# Patient Record
Sex: Female | Born: 1980 | Race: White | Hispanic: No | Marital: Married | Smoking: Current every day smoker
Health system: Southern US, Community
[De-identification: ages and names within clinical notes are randomized; demographics above are authoritative.]

## PROBLEM LIST (undated history)

## (undated) DIAGNOSIS — E079 Disorder of thyroid, unspecified: Secondary | ICD-10-CM

## (undated) DIAGNOSIS — M797 Fibromyalgia: Secondary | ICD-10-CM

## (undated) DIAGNOSIS — F329 Major depressive disorder, single episode, unspecified: Secondary | ICD-10-CM

## (undated) DIAGNOSIS — F32A Depression, unspecified: Secondary | ICD-10-CM

## (undated) DIAGNOSIS — F909 Attention-deficit hyperactivity disorder, unspecified type: Secondary | ICD-10-CM

## (undated) DIAGNOSIS — C801 Malignant (primary) neoplasm, unspecified: Secondary | ICD-10-CM

## (undated) DIAGNOSIS — F419 Anxiety disorder, unspecified: Secondary | ICD-10-CM

## (undated) DIAGNOSIS — N83209 Unspecified ovarian cyst, unspecified side: Secondary | ICD-10-CM

## (undated) HISTORY — PX: APPENDECTOMY: SHX54

## (undated) HISTORY — PX: TONSILLECTOMY: SUR1361

---

## 2005-02-27 ENCOUNTER — Emergency Department: Payer: Self-pay | Admitting: Emergency Medicine

## 2005-03-29 ENCOUNTER — Emergency Department: Payer: Self-pay | Admitting: Emergency Medicine

## 2007-03-06 ENCOUNTER — Ambulatory Visit: Payer: Self-pay | Admitting: Internal Medicine

## 2007-03-07 ENCOUNTER — Inpatient Hospital Stay: Payer: Self-pay | Admitting: General Surgery

## 2008-03-27 ENCOUNTER — Ambulatory Visit: Payer: Self-pay | Admitting: Internal Medicine

## 2010-04-29 ENCOUNTER — Ambulatory Visit: Payer: Self-pay | Admitting: Family Medicine

## 2010-11-10 ENCOUNTER — Ambulatory Visit: Payer: Self-pay | Admitting: Family Medicine

## 2012-01-27 ENCOUNTER — Ambulatory Visit: Payer: Self-pay

## 2013-02-01 ENCOUNTER — Ambulatory Visit: Payer: Self-pay | Admitting: Family Medicine

## 2013-03-02 ENCOUNTER — Ambulatory Visit: Payer: Self-pay | Admitting: Family Medicine

## 2013-03-02 LAB — URINALYSIS, COMPLETE
Bilirubin,UR: NEGATIVE
Glucose,UR: NEGATIVE mg/dL (ref 0–75)
Ketone: NEGATIVE
Nitrite: NEGATIVE

## 2013-03-02 LAB — PREGNANCY, URINE: Pregnancy Test, Urine: NEGATIVE m[IU]/mL

## 2013-11-23 ENCOUNTER — Ambulatory Visit: Payer: Self-pay | Admitting: Family Medicine

## 2013-11-24 ENCOUNTER — Ambulatory Visit: Payer: Self-pay | Admitting: Emergency Medicine

## 2017-04-22 ENCOUNTER — Encounter: Payer: Self-pay | Admitting: Gynecology

## 2017-04-22 ENCOUNTER — Ambulatory Visit
Admission: EM | Admit: 2017-04-22 | Discharge: 2017-04-22 | Disposition: A | Discharge status: Home / Self Care | Payer: Self-pay | Attending: Family Medicine | Admitting: Family Medicine

## 2017-04-22 DIAGNOSIS — R339 Retention of urine, unspecified: Secondary | ICD-10-CM

## 2017-04-22 DIAGNOSIS — N3289 Other specified disorders of bladder: Secondary | ICD-10-CM

## 2017-04-22 HISTORY — DX: Anxiety disorder, unspecified: F41.9

## 2017-04-22 HISTORY — DX: Disorder of thyroid, unspecified: E07.9

## 2017-04-22 HISTORY — DX: Depression, unspecified: F32.A

## 2017-04-22 HISTORY — DX: Attention-deficit hyperactivity disorder, unspecified type: F90.9

## 2017-04-22 HISTORY — DX: Unspecified ovarian cyst, unspecified side: N83.209

## 2017-04-22 HISTORY — DX: Major depressive disorder, single episode, unspecified: F32.9

## 2017-04-22 HISTORY — DX: Fibromyalgia: M79.7

## 2017-04-22 LAB — URINALYSIS, COMPLETE (UACMP) WITH MICROSCOPIC
BACTERIA UA: NONE SEEN
Bilirubin Urine: NEGATIVE
Glucose, UA: NEGATIVE mg/dL
Hgb urine dipstick: NEGATIVE
Ketones, ur: NEGATIVE mg/dL
Leukocytes, UA: NEGATIVE
Nitrite: NEGATIVE
PH: 7.5 (ref 5.0–8.0)
Protein, ur: NEGATIVE mg/dL
RBC / HPF: NONE SEEN RBC/hpf (ref 0–5)
Specific Gravity, Urine: 1.02 (ref 1.005–1.030)
WBC, UA: NONE SEEN WBC/hpf (ref 0–5)

## 2017-04-22 MED ORDER — PHENAZOPYRIDINE HCL 200 MG PO TABS: 200.0000 mg | ORAL_TABLET | Freq: Three times a day (TID) | ORAL | 0 refills | Status: DC | PRN

## 2017-04-22 MED ORDER — TAMSULOSIN HCL 0.4 MG PO CAPS: 0.4000 mg | ORAL_CAPSULE | Freq: Every day | ORAL | 0 refills | Status: DC

## 2017-04-22 NOTE — ED Triage Notes (Signed)
Patient c/o lower back pain and urine retention x yesterday.

## 2017-04-22 NOTE — ED Provider Notes (Signed)
[facility]    CSN: [id number] Arrival date & time: 04/22/17  1358     History   Chief Complaint Chief Complaint  Patient presents with  . Recurrent UTI    HPI Candace Randall is a 36 y.o. female.   Patient is a 36 year old white female who states yesterday she started having sensation of not emptying her bladder completely. Results the bathroom many times but feels that there is something still left in the bladder which is ago. She reports having this years ago she was on Flomax but she did not agree with her and she has some side effects to Flomax. This is atypical for typical UTIs she has. She does smoke. She has a history of ADHD anxiety depression fibromyalgia ovarian cyst disease and right oophorectomy. She's had appendectomy C-section tonsillectomy and right salpingectomy she is allergic to sulfa drugs and latex. No pertinent family medical history relevant to today's visit.   The history is provided by the patient. No language interpreter was used.  Urinary Frequency  The current episode started yesterday. The problem has not changed since onset.Pertinent negatives include no chest pain, no abdominal pain, no headaches and no shortness of breath. Nothing aggravates the symptoms. Nothing relieves the symptoms. She has tried nothing for the symptoms. The treatment provided no relief.    Past Medical History:  Diagnosis Date  . ADHD   . Anxiety   . Depression   . Fibromyalgia   . Ovarian cyst   . Thyroid disease     There are no active problems to display for this patient.   Past Surgical History:  Procedure Laterality Date  . APPENDECTOMY    . CESAREAN SECTION    . TONSILLECTOMY      OB History    No data available       Home Medications    Prior to Admission medications   Medication Sig Start Date End Date Taking? Authorizing Provider  levothyroxine (SYNTHROID, LEVOTHROID) 50 MCG tablet Take 50 mcg by mouth daily before breakfast.    Yes [provider]  norethindrone-ethinyl estradiol-iron (ESTROSTEP FE,TILIA FE,TRI-LEGEST FE) 1-20/1-30/1-35 MG-MCG tablet Take 1 tablet by mouth daily.   Yes [provider]  phenazopyridine (PYRIDIUM) 200 MG tablet Take 1 tablet (200 mg total) by mouth 3 (three) times daily as needed for pain. 04/22/17   Frederich Cha, MD  tamsulosin (FLOMAX) 0.4 MG CAPS capsule Take 1 capsule (0.4 mg total) by mouth daily after supper. 1/2 to 1 capsule at night 04/22/17   Frederich Cha, MD    Family History No family history on file.  Social History Social History  Substance Use Topics  . Smoking status: Current Every Day Smoker    Packs/day: 0.50  . Smokeless tobacco: Never Used  . Alcohol use No     Allergies   Sulfa antibiotics and Latex   Review of Systems Review of Systems  Respiratory: Negative for shortness of breath.   Cardiovascular: Negative for chest pain.  Gastrointestinal: Negative for abdominal pain.  Genitourinary: Positive for frequency. Negative for vaginal discharge and vaginal pain.  Neurological: Negative for headaches.  All other systems reviewed and are negative.    Physical Exam Triage Vital Signs ED Triage Vitals  Enc Vitals Group     BP 04/22/17 1444 117/73     Pulse Rate 04/22/17 1444 (!) 109     Resp 04/22/17 1444 16     Temp 04/22/17 1444 98.7 F (37.1 C)  Temp Source 04/22/17 1444 Oral     SpO2 04/22/17 1444 97 %     Weight 04/22/17 1438 122 lb (55.3 kg)     Height 04/22/17 1438 5\' 2"  (1.575 m)     Head Circumference --      Peak Flow --      Pain Score 04/22/17 1439 8     Pain Loc --      Pain Edu? --      Excl. in [place]? --    No data found.   Updated Vital Signs BP 117/73 (BP Location: Left Arm)   Pulse (!) 109   Temp 98.7 F (37.1 C) (Oral)   Resp 16   Ht 5\' 2"  (1.575 m)   Wt 122 lb (55.3 kg)   LMP 04/08/2017   SpO2 97%   BMI 22.31 kg/m   Visual Acuity Right Eye Distance:   Left Eye Distance:   Bilateral  Distance:    Right Eye Near:   Left Eye Near:    Bilateral Near:     Physical Exam  Constitutional: She is oriented to person, place, and time. She appears well-developed and well-nourished.  HENT:  Head: Normocephalic and atraumatic.  Right Ear: External ear normal.  Left Ear: External ear normal.  Mouth/Throat: Oropharynx is clear and moist.  Eyes: Pupils are equal, round, and reactive to light. Conjunctivae are normal.  Neck: Normal range of motion.  Pulmonary/Chest: Effort normal.  Abdominal: Soft. She exhibits no distension. There is no tenderness.  Musculoskeletal: Normal range of motion.  Neurological: She is alert and oriented to person, place, and time.  Skin: Skin is warm.  Psychiatric: She has a normal mood and affect.  Vitals reviewed.    UC Treatments / Results  Labs (all labs ordered are listed, but only abnormal results are displayed) Labs Reviewed  URINALYSIS, COMPLETE (UACMP) WITH MICROSCOPIC - Abnormal; Notable for the following:       Result Value   Squamous Epithelial / LPF 6-30 (*)    All other components within normal limits  URINE CULTURE    EKG  EKG Interpretation None       Radiology No results found.  Procedures Procedures (including critical care time)  Medications Ordered in UC Medications - No data to display  Results for orders placed or performed during the hospital encounter of 04/22/17  Urinalysis, Complete w Microscopic  Result Value Ref Range   Color, Urine YELLOW YELLOW   APPearance CLEAR CLEAR   Specific Gravity, Urine 1.020 1.005 - 1.030   pH 7.5 5.0 - 8.0   Glucose, UA NEGATIVE NEGATIVE mg/dL   Hgb urine dipstick NEGATIVE NEGATIVE   Bilirubin Urine NEGATIVE NEGATIVE   Ketones, ur NEGATIVE NEGATIVE mg/dL   Protein, ur NEGATIVE NEGATIVE mg/dL   Nitrite NEGATIVE NEGATIVE   Leukocytes, UA NEGATIVE NEGATIVE   Squamous Epithelial / LPF 6-30 (A) NONE SEEN   WBC, UA NONE SEEN 0 - 5 WBC/hpf   RBC / HPF NONE SEEN 0 - 5  RBC/hpf   Bacteria, UA NONE SEEN NONE SEEN   Initial Impression / Assessment and Plan / UC Course  I have reviewed the triage vital signs and the nursing notes.  Pertinent labs & imaging results that were available during my care of the patient were reviewed by me and considered in my medical decision making (see chart for details).     Patient the urinalysis was really essentially unremarkable other than the cells in the urine. She  didn't tolerate the Flomax didn't make her feel well when she took it will go recommend she try maybe half a capsule she's going to try that and take it at night will place him Pyridium 3 times a day and this is just past time until we have back a urine culture just patient does not have a UTI but this sounds this is probably more of a spastic bladder base may have a follow-up urologist in the future.   Final Clinical Impressions(s) / UC Diagnoses   Final diagnoses:  Urinary retention with incomplete bladder emptying  Spasm of bladder   Note: This dictation was prepared with Dragon dictation along with smaller phrase technology. Any transcriptional errors that result from this process are unintentional. New Prescriptions New Prescriptions   PHENAZOPYRIDINE (PYRIDIUM) 200 MG TABLET    Take 1 tablet (200 mg total) by mouth 3 (three) times daily as needed for pain.   TAMSULOSIN (FLOMAX) 0.4 MG CAPS CAPSULE    Take 1 capsule (0.4 mg total) by mouth daily after supper. 1/2 to 1 capsule at night     Controlled Substance Prescriptions [place] Controlled Substance Registry consulted? Not Applicable   Frederich Cha, MD 04/22/17 [phone]

## 2017-04-23 LAB — URINE CULTURE
CULTURE: NO GROWTH
SPECIAL REQUESTS: NORMAL

## 2017-04-24 ENCOUNTER — Telehealth: Payer: Self-pay | Admitting: Emergency Medicine

## 2017-04-24 NOTE — Telephone Encounter (Signed)
Patient called today c/o nausea & vomiting, dizziness and flu like symptoms. Patient was started on Flomax on Saturday (04/22/17) and she is requesting it be changed to something else due to the symptoms she is having.

## 2017-04-24 NOTE — Telephone Encounter (Signed)
Called and left instructions on voice mail per Dr. Zenda Alpers.

## 2017-04-24 NOTE — Telephone Encounter (Signed)
Would not recommend taking flomax; can continue only pyridium; should follow up with PCP and/or urologist

## 2019-03-18 ENCOUNTER — Ambulatory Visit (INDEPENDENT_AMBULATORY_CARE_PROVIDER_SITE_OTHER): Payer: Self-pay

## 2019-03-18 ENCOUNTER — Ambulatory Visit
Admission: EM | Admit: 2019-03-18 | Discharge: 2019-03-18 | Disposition: A | Discharge status: Home / Self Care | Payer: Self-pay | Attending: Family Medicine | Admitting: Family Medicine

## 2019-03-18 DIAGNOSIS — R0602 Shortness of breath: Secondary | ICD-10-CM

## 2019-03-18 DIAGNOSIS — R05 Cough: Secondary | ICD-10-CM

## 2019-03-18 DIAGNOSIS — R059 Cough, unspecified: Secondary | ICD-10-CM

## 2019-03-18 DIAGNOSIS — Z7189 Other specified counseling: Secondary | ICD-10-CM

## 2019-03-18 DIAGNOSIS — T148XXA Other injury of unspecified body region, initial encounter: Secondary | ICD-10-CM

## 2019-03-18 DIAGNOSIS — M545 Low back pain: Secondary | ICD-10-CM

## 2019-03-18 LAB — FIBRIN DERIVATIVES D-DIMER (ARMC ONLY): Fibrin derivatives D-dimer (ARMC): 396.21 ng/mL (FEU) (ref 0.00–499.00)

## 2019-03-18 MED ORDER — CYCLOBENZAPRINE HCL 10 MG PO TABS: 10.0000 mg | ORAL_TABLET | Freq: Two times a day (BID) | ORAL | 0 refills | Status: DC | PRN

## 2019-03-18 MED ORDER — ALBUTEROL SULFATE HFA 108 (90 BASE) MCG/ACT IN AERS: 2.0000 | INHALATION_SPRAY | Freq: Four times a day (QID) | RESPIRATORY_TRACT | 0 refills | Status: DC | PRN

## 2019-03-18 NOTE — Discharge Instructions (Signed)
Take medication as prescribed. Rest. Drink plenty of fluids.  Over-the-counter cough congestion medications as needed.  Please refer to [facility] DHHS information and follow, remain home unless seeking further care.  Follow up with your primary care physician this week as needed. Return to Urgent care for new or worsening concerns.

## 2019-03-18 NOTE — ED Provider Notes (Signed)
[facility] ____________________________________________  Time seen: Approximately 7:36 PM  I have reviewed the triage vital signs and the nursing notes.   HISTORY  Chief Complaint Shortness of Breath   HPI Candace Randall is a 38 y.o. female presenting for evaluation of right shoulder pain and cough.  Patient reports she woke up with pain to her right shoulder on Friday and what she thought was from moving furniture around on Thursday.  Reports the pain continued into Saturday and Sunday, stating she also noticed that she was having pain shooting into her right shoulder with taking breaths.  Patient also reports in the last 2 days she has been having cough and congestion.  States she is having some shortness of breath described as tightness in her chest and almost wheezy.  States shortness of breath is not constant.  States that she feels like there is phlegm in her chest but not able to produce it.  No associated chest pain.  Denies fevers or sore throat.  Positive nasal congestion.  Denies known sick contacts.  Has continued remain active, no immobilization.  No hemoptysis.  States she was worried about pneumonia as she has had that previously with similar sensation.  Unresolved with over-the-counter ibuprofen.  Movement does increase discomfort.  No extremity swelling.  No history of cancer, PE, DVT.  She is a smoker.  On oral contraceptives.  Continues eat and drink well.  No vomiting, diarrhea, abdominal pain, rash, change in taste or smell.  Care, [place] [facility]: PCP   Past Medical History:  Diagnosis Date  . ADHD   . Anxiety   . Depression   . Fibromyalgia   . Ovarian cyst   . Thyroid disease     There are no active problems to display for this patient.   Past Surgical History:  Procedure Laterality Date  . APPENDECTOMY    . CESAREAN SECTION    . TONSILLECTOMY       No current facility-administered medications for this encounter.   Current  Outpatient Medications:  .  albuterol (VENTOLIN HFA) 108 (90 Base) MCG/ACT inhaler, Inhale 2 puffs into the lungs every 6 (six) hours as needed for wheezing., Disp: 6.7 g, Rfl: 0 .  cyclobenzaprine (FLEXERIL) 10 MG tablet, Take 1 tablet (10 mg total) by mouth 2 (two) times daily as needed for muscle spasms. Do not drive while taking as can cause drowsiness, Disp: 15 tablet, Rfl: 0 .  levothyroxine (SYNTHROID, LEVOTHROID) 50 MCG tablet, Take 50 mcg by mouth daily before breakfast., Disp: , Rfl:  .  norethindrone-ethinyl estradiol-iron (ESTROSTEP FE,TILIA FE,TRI-LEGEST FE) 1-20/1-30/1-35 MG-MCG tablet, Take 1 tablet by mouth daily., Disp: , Rfl:  .  phenazopyridine (PYRIDIUM) 200 MG tablet, Take 1 tablet (200 mg total) by mouth 3 (three) times daily as needed for pain., Disp: 15 tablet, Rfl: 0 .  tamsulosin (FLOMAX) 0.4 MG CAPS capsule, Take 1 capsule (0.4 mg total) by mouth daily after supper. 1/2 to 1 capsule at night, Disp: 30 capsule, Rfl: 0  Allergies Sulfa antibiotics and Latex  No family history on file.  Social History Social History   Tobacco Use  . Smoking status: Current Every Day Smoker    Packs/day: 0.05  . Smokeless tobacco: Never Used  Substance Use Topics  . Alcohol use: No  . Drug use: No    Review of Systems Constitutional: No fever. ENT: No sore throat. Cardiovascular: Denies chest pain. Respiratory: positive shortness of breath. Gastrointestinal: No abdominal pain.  No  nausea, no vomiting.  No diarrhea.  No constipation. Genitourinary: Negative for dysuria. Musculoskeletal: Positive for back pain. Skin: Negative for rash. Neurological: Negative for headaches, focal weakness or numbness.   ____________________________________________   PHYSICAL EXAM:  VITAL SIGNS: ED Triage Vitals  Enc Vitals Group     BP 03/18/19 1822 129/79     Pulse Rate 03/18/19 1822 (!) 107     Resp 03/18/19 1822 16     Temp 03/18/19 1822 98.7 F (37.1 C)     Temp Source  03/18/19 1822 Oral     SpO2 03/18/19 1822 98 %     Weight --      Height --      Head Circumference --      Peak Flow --      Pain Score 03/18/19 1825 7     Pain Loc --      Pain Edu? --      Excl. in [place]? --     Constitutional: Alert and oriented. Well appearing and in no acute distress. Eyes: Conjunctivae are normal.  Head: Atraumatic. No sinus tenderness to palpation. No swelling. No erythema.  Ears: no erythema, normal TMs bilaterally.   Nose:Nasal congestion  Cardiovascular: Normal rate, regular rhythm. Grossly normal heart sounds.  Good peripheral circulation. Respiratory: Normal respiratory effort.  No retractions. No wheezes, rales or rhonchi. Good air movement.  Musculoskeletal: Ambulatory with steady gait. No cervical, thoracic or lumbar tenderness to palpation.  No extremity edema noted bilaterally. Except: Right trapezius tenderness to palpation as well as palpable muscle spasm with right cervical rotation, no midline tenderness. Neurologic:  Normal speech and language. No gait instability. Skin:  Skin appears warm, dry and intact. No rash noted. Psychiatric: Mood and affect are normal. Speech and behavior are normal. ___________________________________________   LABS (all labs ordered are listed, but only abnormal results are displayed)  Labs Reviewed  NOVEL CORONAVIRUS, NAA (HOSP ORDER, SEND-OUT TO REF LAB; TAT 18-24 HRS)  FIBRIN DERIVATIVES D-DIMER ([facility] ONLY)    RADIOLOGY  Dg Chest 2 View  Result Date: 03/18/2019 CLINICAL DATA:  Cough, shortness of breath EXAM: CHEST - 2 VIEW COMPARISON:  03/27/2008 FINDINGS: The heart size and mediastinal contours are within normal limits. Both lungs are clear. The visualized skeletal structures are unremarkable. IMPRESSION: No active cardiopulmonary disease. Electronically Signed   By: Jerilynn Mages.  Shick M.D.   On: 03/18/2019 19:35   ____________________________________________   PROCEDURES Procedures    INITIAL IMPRESSION /  ASSESSMENT AND PLAN / ED COURSE  Pertinent labs & imaging results that were available during my care of the patient were reviewed by me and considered in my medical decision making (see chart for details).  Overall well-appearing patient.  No acute distress.  Suspect viral illness as well muscular strain injury.  Chest x-ray as above, negative.  COVID-19 testing completed, [place] DHHS information given and counseled regarding supportive care, remain home unless seeking further care.  D-dimer negative.  Will treat supportively with PRN Flexeril, PRN albuterol inhaler and continue over-the-counter cough congestion medication as needed.Discussed indication, risks and benefits of medications with patient.  Discussed follow up with Primary care physician this week. Discussed follow up and return parameters including no resolution or any worsening concerns. Patient verbalized understanding and agreed to plan.   ____________________________________________   FINAL CLINICAL IMPRESSION(S) / ED DIAGNOSES  Final diagnoses:  Shortness of breath  Cough  Muscle strain  Advice Given About Covid-19 Virus Infection     ED Discharge Orders  Ordered    albuterol (VENTOLIN HFA) 108 (90 Base) MCG/ACT inhaler  Every 6 hours PRN     03/18/19 1958    cyclobenzaprine (FLEXERIL) 10 MG tablet  2 times daily PRN     03/18/19 1958           Note: This dictation was prepared with Dragon dictation along with smaller phrase technology. Any transcriptional errors that result from this process are unintentional.         Marylene Land, NP 03/18/19 [phone]

## 2019-03-18 NOTE — ED Notes (Signed)
Pt in treatment room.  In NAD.  Needs address.  Pt/Fam updated on POC.

## 2019-03-18 NOTE — ED Triage Notes (Signed)
Feels as if can't take deep breath in.   Started Friday with this.  Thursday was lifting heavy things.  But thought it was associated with pulled muscle.  It just didn't go away.   No sick contacts.  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   OTCs at home hasn't fixed all.  Pain has alleviated a bit, but deep breathing worsens pain.    Cough/clearing of throat started Saturday.  Kind of a phlegmy feeling, but "nothing I can produce".   Able to speak in full sentences.  No breathing difficulty noted.    Was tested prior in April due to her father's condition.  It was negative then.

## 2019-03-20 LAB — NOVEL CORONAVIRUS, NAA (HOSP ORDER, SEND-OUT TO REF LAB; TAT 18-24 HRS): SARS-CoV-2, NAA: NOT DETECTED

## 2019-05-02 ENCOUNTER — Encounter: Payer: Self-pay | Admitting: Emergency Medicine

## 2019-05-02 ENCOUNTER — Emergency Department: Payer: Self-pay

## 2019-05-02 ENCOUNTER — Emergency Department
Admission: EM | Admit: 2019-05-02 | Discharge: 2019-05-03 | Disposition: A | Discharge status: Home / Self Care | Payer: Self-pay | Attending: Emergency Medicine | Admitting: Emergency Medicine

## 2019-05-02 ENCOUNTER — Ambulatory Visit
Admission: EM | Admit: 2019-05-02 | Discharge: 2019-05-02 | Disposition: A | Discharge status: Home / Self Care | Payer: Self-pay

## 2019-05-02 ENCOUNTER — Other Ambulatory Visit: Payer: Self-pay

## 2019-05-02 DIAGNOSIS — R300 Dysuria: Secondary | ICD-10-CM | POA: Insufficient documentation

## 2019-05-02 DIAGNOSIS — R112 Nausea with vomiting, unspecified: Secondary | ICD-10-CM | POA: Insufficient documentation

## 2019-05-02 DIAGNOSIS — Z859 Personal history of malignant neoplasm, unspecified: Secondary | ICD-10-CM | POA: Insufficient documentation

## 2019-05-02 DIAGNOSIS — F17200 Nicotine dependence, unspecified, uncomplicated: Secondary | ICD-10-CM | POA: Insufficient documentation

## 2019-05-02 DIAGNOSIS — R102 Pelvic and perineal pain: Secondary | ICD-10-CM | POA: Insufficient documentation

## 2019-05-02 DIAGNOSIS — Z79899 Other long term (current) drug therapy: Secondary | ICD-10-CM | POA: Insufficient documentation

## 2019-05-02 DIAGNOSIS — R52 Pain, unspecified: Secondary | ICD-10-CM

## 2019-05-02 DIAGNOSIS — R1031 Right lower quadrant pain: Secondary | ICD-10-CM

## 2019-05-02 DIAGNOSIS — R32 Unspecified urinary incontinence: Secondary | ICD-10-CM | POA: Insufficient documentation

## 2019-05-02 DIAGNOSIS — Z91040 Latex allergy status: Secondary | ICD-10-CM | POA: Insufficient documentation

## 2019-05-02 HISTORY — DX: Malignant (primary) neoplasm, unspecified: C80.1

## 2019-05-02 LAB — URINALYSIS, COMPLETE (UACMP) WITH MICROSCOPIC
Bacteria, UA: NONE SEEN
Bilirubin Urine: NEGATIVE
Glucose, UA: NEGATIVE mg/dL
Ketones, ur: NEGATIVE mg/dL
Leukocytes,Ua: NEGATIVE
Nitrite: NEGATIVE
Protein, ur: NEGATIVE mg/dL
Specific Gravity, Urine: 1.016 (ref 1.005–1.030)
pH: 6 (ref 5.0–8.0)

## 2019-05-02 LAB — COMPREHENSIVE METABOLIC PANEL
ALT: 15 U/L (ref 0–44)
AST: 16 U/L (ref 15–41)
Albumin: 4 g/dL (ref 3.5–5.0)
Alkaline Phosphatase: 61 U/L (ref 38–126)
Anion gap: 7 (ref 5–15)
BUN: 10 mg/dL (ref 6–20)
CO2: 26 mmol/L (ref 22–32)
Calcium: 9.2 mg/dL (ref 8.9–10.3)
Chloride: 103 mmol/L (ref 98–111)
Creatinine, Ser: 0.63 mg/dL (ref 0.44–1.00)
GFR calc Af Amer: >60 mL/min (ref >60)
GFR calc non Af Amer: >60 mL/min (ref >60)
Glucose, Bld: 107 mg/dL — ABNORMAL HIGH (ref 70–99)
Potassium: 3.8 mmol/L (ref 3.5–5.1)
Sodium: 136 mmol/L (ref 135–145)
Total Bilirubin: 0.3 mg/dL (ref 0.3–1.2)
Total Protein: 7.2 g/dL (ref 6.5–8.1)

## 2019-05-02 LAB — LIPASE, BLOOD: Lipase: 23 U/L (ref 11–51)

## 2019-05-02 LAB — CBC
HCT: 38.5 % (ref 36.0–46.0)
Hemoglobin: 12.6 g/dL (ref 12.0–15.0)
MCH: 27.1 pg (ref 26.0–34.0)
MCHC: 32.7 g/dL (ref 30.0–36.0)
MCV: 82.8 fL (ref 80.0–100.0)
Platelets: 349 10*3/uL (ref 150–400)
RBC: 4.65 MIL/uL (ref 3.87–5.11)
RDW: 12.9 % (ref 11.5–15.5)
WBC: 14.1 10*3/uL — ABNORMAL HIGH (ref 4.0–10.5)
nRBC: 0 % (ref 0.0–0.2)

## 2019-05-02 LAB — POCT PREGNANCY, URINE: Preg Test, Ur: NEGATIVE

## 2019-05-02 MED ORDER — MORPHINE SULFATE (PF) 4 MG/ML IV SOLN
4.0000 mg | Freq: Once | INTRAVENOUS | Status: AC
  Administered 2019-05-02: 4 mg via INTRAVENOUS
  Filled 2019-05-02: qty 1

## 2019-05-02 MED ORDER — ONDANSETRON 8 MG PO TBDP
8.0000 mg | ORAL_TABLET | Freq: Once | ORAL | Status: AC
  Administered 2019-05-02: 19:00:00 8 mg via ORAL

## 2019-05-02 MED ORDER — ONDANSETRON HCL 4 MG/2ML IJ SOLN
4.0000 mg | Freq: Once | INTRAMUSCULAR | Status: AC
  Administered 2019-05-02: 4 mg via INTRAVENOUS
  Filled 2019-05-02: qty 2

## 2019-05-02 NOTE — ED Triage Notes (Signed)
Patient c/o abdominal pain that started yesterday. She states she can feel a bump in her right lower abdomen. She also reports that today she feels bloated. Patient states she vomited x 2 day. Denies diarrhea.

## 2019-05-02 NOTE — ED Triage Notes (Signed)
Pt to ED from UC for CT and [place] for possible ovarian cyst. Pt sts UC only gave her zofran and did no lab or imaging. Pt has had LRQ pain x2 days with N/V.

## 2019-05-02 NOTE — ED Provider Notes (Signed)
[facility] Emergency Department Provider Note   First MD Initiated Contact with Patient 05/02/19 2336     (approximate)  I have reviewed the triage vital signs and the nursing notes.   HISTORY  Chief Complaint Abdominal Pain   HPI Candace Randall is a 38 y.o. female with below list of previous medical conditions presents to the emergency department secondary to 1 day history of lower abdominal pain which is currently 9 out of 10.  Patient states dysuria for over a year.  Patient denies any fever.  Patient does admit to nausea and vomiting.  Patient denies any diarrhea or constipation.  Patient states that she has been seen by gynecology and urology without any clear explanation for her dysuria or pelvic discomfort..  In addition patient admits to "difficulty holding my urine"        Past Medical History:  Diagnosis Date   ADHD    Anxiety    Cancer ([place])    Depression    Fibromyalgia    Ovarian cyst    Thyroid disease     There are no active problems to display for this patient.   Past Surgical History:  Procedure Laterality Date   APPENDECTOMY     CESAREAN SECTION     TONSILLECTOMY      Prior to Admission medications   Medication Sig Start Date End Date Taking? Authorizing Provider  albuterol (VENTOLIN HFA) 108 (90 Base) MCG/ACT inhaler Inhale 2 puffs into the lungs every 6 (six) hours as needed for wheezing. 03/18/19   Marylene Land, NP  clonazePAM (KLONOPIN) 0.5 MG tablet Take 0.5 mg by mouth 2 (two) times daily.    [provider]  cyclobenzaprine (FLEXERIL) 10 MG tablet Take 1 tablet (10 mg total) by mouth 2 (two) times daily as needed for muscle spasms. Do not drive while taking as can cause drowsiness 03/18/19   Marylene Land, NP  levothyroxine (SYNTHROID, LEVOTHROID) 50 MCG tablet Take 50 mcg by mouth daily before breakfast.    [provider]  norethindrone-ethinyl estradiol-iron (ESTROSTEP  FE,TILIA FE,TRI-LEGEST FE) 1-20/1-30/1-35 MG-MCG tablet Take 1 tablet by mouth daily.    [provider]  phenazopyridine (PYRIDIUM) 200 MG tablet Take 1 tablet (200 mg total) by mouth 3 (three) times daily as needed for pain. 04/22/17   Frederich Cha, MD  tamsulosin (FLOMAX) 0.4 MG CAPS capsule Take 1 capsule (0.4 mg total) by mouth daily after supper. 1/2 to 1 capsule at night 04/22/17   Frederich Cha, MD  traMADol (ULTRAM) 50 MG tablet Take by mouth every 6 (six) hours as needed.    [provider]  traMADol (ULTRAM) 50 MG tablet Take 1 tablet (50 mg total) by mouth every 6 (six) hours as needed. 05/03/19 05/02/20  Gregor Hams, MD    Allergies Sulfa antibiotics and Latex  History reviewed. No pertinent family history.  Social History Social History   Tobacco Use   Smoking status: Current Every Day Smoker    Packs/day: 0.05   Smokeless tobacco: Never Used  Substance Use Topics   Alcohol use: No   Drug use: No    Review of Systems Constitutional: No fever/chills Eyes: No visual changes. ENT: No sore throat. Cardiovascular: Denies chest pain. Respiratory: Denies shortness of breath. Gastrointestinal: Positive for abdominal pain nausea and vomiting no diarrhea.  No constipation. Genitourinary: Negative for dysuria. Musculoskeletal: Negative for neck pain.  Negative for back pain. Integumentary: Negative for rash. Neurological: Negative for headaches,  focal weakness or numbness.   ____________________________________________   PHYSICAL EXAM:  VITAL SIGNS: ED Triage Vitals  Enc Vitals Group     BP 05/02/19 2002 136/89     Pulse Rate 05/02/19 2002 (!) 120     Resp 05/02/19 2002 18     Temp 05/02/19 2002 99 F (37.2 C)     Temp Source 05/02/19 2002 Oral     SpO2 05/02/19 2002 100 %     Weight 05/02/19 2003 59 kg (130 lb)     Height 05/02/19 2003 1.575 m (5\' 2" )     Head Circumference --      Peak Flow --      Pain Score --      Pain Loc --       Pain Edu? --      Excl. in [place]? --     Constitutional: Alert and oriented.  Eyes: Conjunctivae are normal.  Mouth/Throat: Mucous membranes are moist. Neck: No stridor.  No meningeal signs.   Cardiovascular: Normal rate, regular rhythm. Good peripheral circulation. Grossly normal heart sounds. Respiratory: Normal respiratory effort.  No retractions. Gastrointestinal: Soft and nontender. No distention.  Genitourinary: No abnormal findings on external genitalia. Musculoskeletal: No lower extremity tenderness nor edema. No gross deformities of extremities. Neurologic:  Normal speech and language. No gross focal neurologic deficits are appreciated.  Skin:  Skin is warm, dry and intact. Psychiatric: Mood and affect are normal. Speech and behavior are normal.  ____________________________________________   LABS (all labs ordered are listed, but only abnormal results are displayed)  Labs Reviewed  WET PREP, GENITAL - Abnormal; Notable for the following components:      Result Value   WBC, Wet Prep HPF POC FEW (*)    All other components within normal limits  COMPREHENSIVE METABOLIC PANEL - Abnormal; Notable for the following components:   Glucose, Bld 107 (*)    All other components within normal limits  CBC - Abnormal; Notable for the following components:   WBC 14.1 (*)    All other components within normal limits  URINALYSIS, COMPLETE (UACMP) WITH MICROSCOPIC - Abnormal; Notable for the following components:   Color, Urine YELLOW (*)    APPearance HAZY (*)    Hgb urine dipstick SMALL (*)    All other components within normal limits  URINE CULTURE  LIPASE, BLOOD  POC URINE PREG, ED  POCT PREGNANCY, URINE   _________________________  RADIOLOGY I, [place] N Ion Gonnella, personally viewed and evaluated these images (plain radiographs) as part of my medical decision making, as well as reviewing the written report by the radiologist.  ED MD interpretation: No acute intra-abdominal  pelvic pathology noted on CT abdomen pelvis Pelvic ultrasound revealed no acute findings.  Official radiology report(s): Ct Abdomen Pelvis W Contrast  Result Date: 05/03/2019 CLINICAL DATA:  Left lower quadrant abdominal pain EXAM: CT ABDOMEN AND PELVIS WITH CONTRAST TECHNIQUE: Multidetector CT imaging of the abdomen and pelvis was performed using the standard protocol following bolus administration of intravenous contrast. CONTRAST:  165mL OMNIPAQUE IOHEXOL 300 MG/ML  SOLN COMPARISON:  Ultrasound same day FINDINGS: Lower chest: The visualized heart size within normal limits. No pericardial fluid/thickening. No hiatal hernia. The visualized portions of the lungs are clear. Hepatobiliary: The liver is normal in density without focal abnormality.The main portal vein is patent. No evidence of calcified gallstones, gallbladder wall thickening or biliary dilatation. Pancreas: Unremarkable. No pancreatic ductal dilatation or surrounding inflammatory changes. Spleen: Normal in size without focal abnormality. Adrenals/Urinary  Tract: Again noted is nodular thickening of the left adrenal gland. The right adrenal gland is unremarkable. He the kidneys and collecting system appear normal without evidence of urinary tract calculus or hydronephrosis. Bladder is unremarkable. Stomach/Bowel: The stomach, small bowel, and colon are normal in appearance. No inflammatory changes, wall thickening, or obstructive findings.Surgical suture in the right lower quadrant. Vascular/Lymphatic: There are no enlarged mesenteric, retroperitoneal, or pelvic lymph nodes. No significant vascular findings are present. Reproductive: Other: No evidence of abdominal wall mass or hernia. Musculoskeletal: No acute or significant osseous findings. IMPRESSION: No acute intra-abdominal or pelvic pathology. Electronically Signed   By: Prudencio Pair M.D.   On: 05/03/2019 01:27   US Pelvic Complete W Transvaginal And Torsion R/o  Result Date:  05/02/2019 CLINICAL DATA:  Right lower quadrant pain EXAM: TRANSABDOMINAL AND TRANSVAGINAL ULTRASOUND OF PELVIS DOPPLER ULTRASOUND OF OVARIES TECHNIQUE: Both transabdominal and transvaginal ultrasound examinations of the pelvis were performed. Transabdominal technique was performed for global imaging of the pelvis including uterus, ovaries, adnexal regions, and pelvic cul-de-sac. It was necessary to proceed with endovaginal exam following the transabdominal exam to visualize the uterus and ovaries. Color and duplex Doppler ultrasound was utilized to evaluate blood flow to the ovaries. COMPARISON:  None. FINDINGS: Uterus Measurements: 7.6 x 3.1 x 3.8 cm = volume: 48 mL. No fibroids or other mass visualized. Endometrium Thickness: 6 mm.  No focal abnormality visualized. Right ovary Measurements: 1.7 x 1.2 x 1.8 cm = volume: 2 mL. Normal appearance/no adnexal mass. Left ovary Surgically absent. Pulsed Doppler evaluation of the right ovary demonstrates normal low-resistance arterial and venous waveforms. Other findings No abnormal free fluid. IMPRESSION: Normal pelvic ultrasound and Doppler evaluation Electronically Signed   By: Ulyses Jarred M.D.   On: 05/02/2019 21:51    ____________________________________________   PROCEDURES   Procedure(s) performed (including Critical Care):  Procedures   ____________________________________________   INITIAL IMPRESSION / MDM / [place] / ED COURSE  As part of my medical decision making, I reviewed the following data within the electronic MEDICAL RECORD NUMBER  38 year old female presenting with above-stated history and physical exam secondary to chronic pain and dysuria.  No clear etiology for the patient's symptoms identified urinalysis revealed no evidence of UTI urine culture pending pelvic ultrasound and CT scan of the abdomen pelvis revealed no acute injury abdominal or pelvic pathology.  Patient will be referred to urology for further outpatient  evaluation  ____________________________________________  FINAL CLINICAL IMPRESSION(S) / ED DIAGNOSES  Final diagnoses:  Pain  Pelvic pain in female  Dysuria     MEDICATIONS GIVEN DURING THIS VISIT:  Medications  morphine 4 MG/ML injection 4 mg (4 mg Intravenous Given 05/02/19 2352)  ondansetron (ZOFRAN) injection 4 mg (4 mg Intravenous Given 05/02/19 2352)  iohexol (OMNIPAQUE) 9 MG/ML oral solution 1,000 mL (1,000 mLs Oral Contrast Given 05/03/19 0007)  iohexol (OMNIPAQUE) 300 MG/ML solution 100 mL (100 mLs Intravenous Contrast Given 05/03/19 0044)  morphine 4 MG/ML injection 4 mg (4 mg Intravenous Given 05/03/19 0142)     ED Discharge Orders         Ordered    traMADol (ULTRAM) 50 MG tablet  Every 6 hours PRN     05/03/19 0240          *Please note:  Emmalise Renze was evaluated in Emergency Department on 05/03/2019 for the symptoms described in the history of present illness. She was evaluated in the context of the global COVID-19 pandemic, which necessitated  consideration that the patient might be at risk for infection with the SARS-CoV-2 virus that causes COVID-19. Institutional protocols and algorithms that pertain to the evaluation of patients at risk for COVID-19 are in a state of rapid change based on information released by regulatory bodies including the CDC and federal and state organizations. These policies and algorithms were followed during the patient's care in the ED.  Some ED evaluations and interventions may be delayed as a result of limited staffing during the pandemic.*  Note:  This document was prepared using Dragon voice recognition software and may include unintentional dictation errors.   Gregor Hams, MD 05/03/19 [phone]

## 2019-05-02 NOTE — ED Provider Notes (Signed)
[facility] ____________________________________________  Time seen: Approximately 7:18 PM  I have reviewed the triage vital signs and the nursing notes.   HISTORY  Chief Complaint Abdominal Pain   HPI Candace Randall is a 38 y.o. female      presenting for evaluation of right lower abdominal pelvic pain present since yesterday that has been progressing and worsening.  Continues with normal bowel habits.  2 episodes of vomiting today with continued nausea with associated pain.  States pain does intermittently radiate to her right lower back.  No dysuria.  Declines fevers or injury.  Denies vaginal complaints.  Previous appendectomy as well as left oophorectomy second to ruptured ovarian cyst.  Expresses concern as current pain feels similar to previous ruptured ovarian cyst.  3 month oral contraceptive use, declines pregnancy.  States she is right around the time to be starting her menstrual cycle.  Denies recent sickness.    Past Medical History:  Diagnosis Date   ADHD    Anxiety    Depression    Fibromyalgia    Ovarian cyst    Thyroid disease     There are no active problems to display for this patient.   Past Surgical History:  Procedure Laterality Date   APPENDECTOMY     CESAREAN SECTION     TONSILLECTOMY       No current facility-administered medications for this encounter.   Current Outpatient Medications:    clonazePAM (KLONOPIN) 0.5 MG tablet, Take 0.5 mg by mouth 2 (two) times daily., Disp: , Rfl:    levothyroxine (SYNTHROID, LEVOTHROID) 50 MCG tablet, Take 50 mcg by mouth daily before breakfast., Disp: , Rfl:    norethindrone-ethinyl estradiol-iron (ESTROSTEP FE,TILIA FE,TRI-LEGEST FE) 1-20/1-30/1-35 MG-MCG tablet, Take 1 tablet by mouth daily., Disp: , Rfl:    traMADol (ULTRAM) 50 MG tablet, Take by mouth every 6 (six) hours as needed., Disp: , Rfl:    albuterol (VENTOLIN HFA) 108 (90 Base) MCG/ACT inhaler, Inhale 2 puffs  into the lungs every 6 (six) hours as needed for wheezing., Disp: 6.7 g, Rfl: 0   cyclobenzaprine (FLEXERIL) 10 MG tablet, Take 1 tablet (10 mg total) by mouth 2 (two) times daily as needed for muscle spasms. Do not drive while taking as can cause drowsiness, Disp: 15 tablet, Rfl: 0   phenazopyridine (PYRIDIUM) 200 MG tablet, Take 1 tablet (200 mg total) by mouth 3 (three) times daily as needed for pain., Disp: 15 tablet, Rfl: 0   tamsulosin (FLOMAX) 0.4 MG CAPS capsule, Take 1 capsule (0.4 mg total) by mouth daily after supper. 1/2 to 1 capsule at night, Disp: 30 capsule, Rfl: 0  Allergies Sulfa antibiotics and Latex  History reviewed. No pertinent family history.  Social History Social History   Tobacco Use   Smoking status: Current Every Day Smoker    Packs/day: 0.05   Smokeless tobacco: Never Used  Substance Use Topics   Alcohol use: No   Drug use: No    Review of Systems Constitutional: No fever ENT: No sore throat. Cardiovascular: Denies chest pain. Respiratory: Denies shortness of breath. Gastrointestinal: Positive abdominal pain.  Genitourinary: Negative for dysuria. Musculoskeletal: Positive for back pain. Skin: Negative for rash.  ____________________________________________   PHYSICAL EXAM:  VITAL SIGNS: ED Triage Vitals  Enc Vitals Group     BP 05/02/19 1837 (!) 154/95     Pulse Rate 05/02/19 1837 (!) 126     Resp 05/02/19 1837 18     Temp 05/02/19 1837  98.2 F (36.8 C)     Temp Source 05/02/19 1837 Oral     SpO2 05/02/19 1837 99 %     Weight 05/02/19 1838 130 lb (59 kg)     Height 05/02/19 1838 5\' 2"  (1.575 m)     Head Circumference --      Peak Flow --      Pain Score 05/02/19 1838 10     Pain Loc --      Pain Edu? --      Excl. in [place]? --     Constitutional: Alert and oriented.  Patient appears uncomfortable. Eyes: Conjunctivae are normal.  ENT      Head: Normocephalic and atraumatic. Cardiovascular: Tachycardia. grossly normal heart  sounds. Respiratory: Normal respiratory effort without tachypnea nor retractions. Breath sounds are clear and equal bilaterally. No wheezes, rales, rhonchi. Gastrointestinal:  Normal Bowel sounds.  Diffuse abdominal tenderness with moderate right lower quadrant and right suprapubic tenderness to direct palpation with guarding.  No CVA tenderness. Musculoskeletal: Steady gait. Neurologic:  Normal speech and language Skin:  Skin is warm, dry and intact. No rash noted. Psychiatric: Mood and affect are normal. Speech and behavior are normal. Patient exhibits appropriate insight and judgment   ___________________________________________   LABS (all labs ordered are listed, but only abnormal results are displayed)  Labs Reviewed - No data to display ____________________________________________   PROCEDURES Procedures   INITIAL IMPRESSION / ASSESSMENT AND PLAN / ED COURSE  Pertinent labs & imaging results that were available during my care of the patient were reviewed by me and considered in my medical decision making (see chart for details).  Patient with acute onset of worsening right lower abdominal right pelvic pain.  Previous ovarian cyst rupture with similar presentation per patient, concern for same versus torsion.  Recommend further evaluation in the emergency room at this time.  8mg  ODT Zofran given once.  Patient husband to transport by private car.  Lattie Haw RN triage nurse called and given report at [facility].  Patient agrees with plan.  ____________________________________________   FINAL CLINICAL IMPRESSION(S) / ED DIAGNOSES  Final diagnoses:  Right lower quadrant abdominal pain     ED Discharge Orders    None       Note: This dictation was prepared with Dragon dictation along with smaller phrase technology. Any transcriptional errors that result from this process are unintentional.         Marylene Land, NP 05/02/19 1936

## 2019-05-02 NOTE — Discharge Instructions (Addendum)
Go directly to the emergency room.  Remain in nothing to eat or drink.

## 2019-05-03 ENCOUNTER — Encounter: Payer: Self-pay | Admitting: Radiology

## 2019-05-03 ENCOUNTER — Emergency Department: Payer: Self-pay

## 2019-05-03 LAB — WET PREP, GENITAL
Clue Cells Wet Prep HPF POC: NONE SEEN
Sperm: NONE SEEN
Trich, Wet Prep: NONE SEEN
Yeast Wet Prep HPF POC: NONE SEEN

## 2019-05-03 MED ORDER — IOHEXOL 9 MG/ML PO SOLN
1000.0000 mL | Freq: Once | ORAL | Status: AC | PRN
  Administered 2019-05-03: 1000 mL via ORAL

## 2019-05-03 MED ORDER — MORPHINE SULFATE (PF) 4 MG/ML IV SOLN
4.0000 mg | Freq: Once | INTRAVENOUS | Status: AC
  Administered 2019-05-03: 02:00:00 4 mg via INTRAVENOUS

## 2019-05-03 MED ORDER — TRAMADOL HCL 50 MG PO TABS: 50.0000 mg | ORAL_TABLET | Freq: Four times a day (QID) | ORAL | 0 refills | Status: DC | PRN

## 2019-05-03 MED ORDER — MORPHINE SULFATE (PF) 4 MG/ML IV SOLN
INTRAVENOUS | Status: AC
  Administered 2019-05-03: 4 mg via INTRAVENOUS
  Filled 2019-05-03: qty 1

## 2019-05-03 MED ORDER — IOHEXOL 300 MG/ML  SOLN
100.0000 mL | Freq: Once | INTRAMUSCULAR | Status: AC | PRN
  Administered 2019-05-03: 100 mL via INTRAVENOUS

## 2019-05-03 NOTE — ED Notes (Signed)
Pt states cannot finish contrast bc she feels nauseated. Called CT and let them know

## 2019-05-04 LAB — URINE CULTURE: Culture: <10000 — AB

## 2020-01-26 ENCOUNTER — Other Ambulatory Visit: Payer: Self-pay

## 2020-01-26 ENCOUNTER — Ambulatory Visit
Admission: EM | Admit: 2020-01-26 | Discharge: 2020-01-26 | Disposition: A | Discharge status: Home / Self Care | Payer: HRSA Program | Attending: Family Medicine | Admitting: Family Medicine

## 2020-01-26 ENCOUNTER — Emergency Department: Payer: Self-pay

## 2020-01-26 ENCOUNTER — Encounter: Payer: Self-pay | Admitting: Emergency Medicine

## 2020-01-26 ENCOUNTER — Observation Stay
Admission: EM | Admit: 2020-01-26 | Discharge: 2020-01-27 | Disposition: A | Discharge status: Home / Self Care | Payer: Self-pay | Attending: Internal Medicine | Admitting: Internal Medicine

## 2020-01-26 ENCOUNTER — Inpatient Hospital Stay: Payer: Self-pay

## 2020-01-26 DIAGNOSIS — Z79899 Other long term (current) drug therapy: Secondary | ICD-10-CM | POA: Insufficient documentation

## 2020-01-26 DIAGNOSIS — M62838 Other muscle spasm: Secondary | ICD-10-CM

## 2020-01-26 DIAGNOSIS — F419 Anxiety disorder, unspecified: Secondary | ICD-10-CM | POA: Insufficient documentation

## 2020-01-26 DIAGNOSIS — I82402 Acute embolism and thrombosis of unspecified deep veins of left lower extremity: Secondary | ICD-10-CM

## 2020-01-26 DIAGNOSIS — M797 Fibromyalgia: Secondary | ICD-10-CM | POA: Insufficient documentation

## 2020-01-26 DIAGNOSIS — R9431 Abnormal electrocardiogram [ECG] [EKG]: Secondary | ICD-10-CM | POA: Diagnosis not present

## 2020-01-26 DIAGNOSIS — F17210 Nicotine dependence, cigarettes, uncomplicated: Secondary | ICD-10-CM | POA: Insufficient documentation

## 2020-01-26 DIAGNOSIS — E079 Disorder of thyroid, unspecified: Secondary | ICD-10-CM | POA: Insufficient documentation

## 2020-01-26 DIAGNOSIS — J019 Acute sinusitis, unspecified: Secondary | ICD-10-CM | POA: Insufficient documentation

## 2020-01-26 DIAGNOSIS — H65191 Other acute nonsuppurative otitis media, right ear: Secondary | ICD-10-CM | POA: Insufficient documentation

## 2020-01-26 DIAGNOSIS — R0602 Shortness of breath: Secondary | ICD-10-CM | POA: Diagnosis not present

## 2020-01-26 DIAGNOSIS — E871 Hypo-osmolality and hyponatremia: Secondary | ICD-10-CM | POA: Insufficient documentation

## 2020-01-26 DIAGNOSIS — Z20822 Contact with and (suspected) exposure to covid-19: Secondary | ICD-10-CM | POA: Insufficient documentation

## 2020-01-26 DIAGNOSIS — Z882 Allergy status to sulfonamides status: Secondary | ICD-10-CM | POA: Insufficient documentation

## 2020-01-26 DIAGNOSIS — Z793 Long term (current) use of hormonal contraceptives: Secondary | ICD-10-CM | POA: Insufficient documentation

## 2020-01-26 DIAGNOSIS — I2699 Other pulmonary embolism without acute cor pulmonale: Principal | ICD-10-CM | POA: Diagnosis present

## 2020-01-26 DIAGNOSIS — B9689 Other specified bacterial agents as the cause of diseases classified elsewhere: Secondary | ICD-10-CM

## 2020-01-26 DIAGNOSIS — J069 Acute upper respiratory infection, unspecified: Secondary | ICD-10-CM | POA: Diagnosis not present

## 2020-01-26 DIAGNOSIS — E039 Hypothyroidism, unspecified: Secondary | ICD-10-CM

## 2020-01-26 DIAGNOSIS — M62830 Muscle spasm of back: Secondary | ICD-10-CM | POA: Insufficient documentation

## 2020-01-26 DIAGNOSIS — J329 Chronic sinusitis, unspecified: Secondary | ICD-10-CM

## 2020-01-26 DIAGNOSIS — H65111 Acute and subacute allergic otitis media (mucoid) (sanguinous) (serous), right ear: Secondary | ICD-10-CM

## 2020-01-26 DIAGNOSIS — Z79890 Hormone replacement therapy (postmenopausal): Secondary | ICD-10-CM | POA: Insufficient documentation

## 2020-01-26 DIAGNOSIS — R000 Tachycardia, unspecified: Secondary | ICD-10-CM | POA: Diagnosis not present

## 2020-01-26 DIAGNOSIS — Z720 Tobacco use: Secondary | ICD-10-CM

## 2020-01-26 LAB — COMPREHENSIVE METABOLIC PANEL
ALT: 51 U/L — ABNORMAL HIGH (ref 0–44)
AST: 37 U/L (ref 15–41)
Albumin: 3.7 g/dL (ref 3.5–5.0)
Alkaline Phosphatase: 103 U/L (ref 38–126)
Anion gap: 9 (ref 5–15)
BUN: 11 mg/dL (ref 6–20)
CO2: 25 mmol/L (ref 22–32)
Calcium: 8.5 mg/dL — ABNORMAL LOW (ref 8.9–10.3)
Chloride: 98 mmol/L (ref 98–111)
Creatinine, Ser: 0.7 mg/dL (ref 0.44–1.00)
GFR calc Af Amer: >60 mL/min (ref >60)
GFR calc non Af Amer: >60 mL/min (ref >60)
Glucose, Bld: 127 mg/dL — ABNORMAL HIGH (ref 70–99)
Potassium: 3.5 mmol/L (ref 3.5–5.1)
Sodium: 132 mmol/L — ABNORMAL LOW (ref 135–145)
Total Bilirubin: 0.4 mg/dL (ref 0.3–1.2)
Total Protein: 7.2 g/dL (ref 6.5–8.1)

## 2020-01-26 LAB — CBC WITH DIFFERENTIAL/PLATELET
Abs Immature Granulocytes: 0.2 10*3/uL — ABNORMAL HIGH (ref 0.00–0.07)
Basophils Absolute: 0.2 10*3/uL — ABNORMAL HIGH (ref 0.0–0.1)
Basophils Relative: 2 %
Eosinophils Absolute: 0.5 10*3/uL (ref 0.0–0.5)
Eosinophils Relative: 5 %
HCT: 39.9 % (ref 36.0–46.0)
Hemoglobin: 13.6 g/dL (ref 12.0–15.0)
Immature Granulocytes: 2 %
Lymphocytes Relative: 30 %
Lymphs Abs: 3.1 10*3/uL (ref 0.7–4.0)
MCH: 26.4 pg (ref 26.0–34.0)
MCHC: 34.1 g/dL (ref 30.0–36.0)
MCV: 77.5 fL — ABNORMAL LOW (ref 80.0–100.0)
Monocytes Absolute: 0.5 10*3/uL (ref 0.1–1.0)
Monocytes Relative: 5 %
Neutro Abs: 5.9 10*3/uL (ref 1.7–7.7)
Neutrophils Relative %: 56 %
Platelets: 271 10*3/uL (ref 150–400)
RBC: 5.15 MIL/uL — ABNORMAL HIGH (ref 3.87–5.11)
RDW: 13.2 % (ref 11.5–15.5)
Smear Review: NORMAL
WBC: 10.2 10*3/uL (ref 4.0–10.5)
nRBC: 0 % (ref 0.0–0.2)

## 2020-01-26 LAB — TROPONIN I (HIGH SENSITIVITY)
Troponin I (High Sensitivity): 4 ng/L (ref <18)
Troponin I (High Sensitivity): 3 ng/L (ref <18)

## 2020-01-26 LAB — URINALYSIS, COMPLETE (UACMP) WITH MICROSCOPIC
Bilirubin Urine: NEGATIVE
Glucose, UA: NEGATIVE mg/dL
Ketones, ur: NEGATIVE mg/dL
Leukocytes,Ua: NEGATIVE
Nitrite: NEGATIVE
Protein, ur: NEGATIVE mg/dL
Specific Gravity, Urine: 1.003 — ABNORMAL LOW (ref 1.005–1.030)
pH: 6 (ref 5.0–8.0)

## 2020-01-26 LAB — POCT PREGNANCY, URINE: Preg Test, Ur: NEGATIVE

## 2020-01-26 LAB — SARS CORONAVIRUS 2 BY RT PCR (HOSPITAL ORDER, PERFORMED IN ~~LOC~~ HOSPITAL LAB): SARS Coronavirus 2: NEGATIVE

## 2020-01-26 LAB — PROTIME-INR
INR: 1.1 (ref 0.8–1.2)
Prothrombin Time: 13.7 seconds (ref 11.4–15.2)

## 2020-01-26 LAB — SARS CORONAVIRUS 2 (TAT 6-24 HRS): SARS Coronavirus 2: NEGATIVE

## 2020-01-26 LAB — LACTIC ACID, PLASMA
Lactic Acid, Venous: 2.1 mmol/L (ref 0.5–1.9)
Lactic Acid, Venous: 1.3 mmol/L (ref 0.5–1.9)

## 2020-01-26 LAB — TSH: TSH: 1.654 u[IU]/mL (ref 0.350–4.500)

## 2020-01-26 LAB — FIBRIN DERIVATIVES D-DIMER (ARMC ONLY): Fibrin derivatives D-dimer (ARMC): 3560.58 ng/mL (FEU) — ABNORMAL HIGH (ref 0.00–499.00)

## 2020-01-26 LAB — APTT: aPTT: 36 seconds (ref 24–36)

## 2020-01-26 MED ORDER — SODIUM CHLORIDE 0.9 % IV BOLUS
1000.0000 mL | Freq: Once | INTRAVENOUS | Status: AC
  Administered 2020-01-26: 1000 mL via INTRAVENOUS

## 2020-01-26 MED ORDER — LEVOTHYROXINE SODIUM 50 MCG PO TABS
50.0000 ug | ORAL_TABLET | Freq: Every day | ORAL | Status: DC
  Administered 2020-01-27: 50 ug via ORAL
  Filled 2020-01-26: qty 1

## 2020-01-26 MED ORDER — IOHEXOL 350 MG/ML SOLN
75.0000 mL | Freq: Once | INTRAVENOUS | Status: AC | PRN
  Administered 2020-01-26: 75 mL via INTRAVENOUS

## 2020-01-26 MED ORDER — TRAMADOL HCL 50 MG PO TABS
50.0000 mg | ORAL_TABLET | Freq: Four times a day (QID) | ORAL | Status: DC | PRN
  Administered 2020-01-26 – 2020-01-27 (×2): 50 mg via ORAL
  Filled 2020-01-26 (×2): qty 1

## 2020-01-26 MED ORDER — AMOXICILLIN-POT CLAVULANATE 875-125 MG PO TABS
1.0000 | ORAL_TABLET | Freq: Two times a day (BID) | ORAL | Status: DC
  Administered 2020-01-26 – 2020-01-27 (×2): 1 via ORAL
  Filled 2020-01-26 (×2): qty 1

## 2020-01-26 MED ORDER — CLONAZEPAM 0.5 MG PO TABS
0.5000 mg | ORAL_TABLET | Freq: Two times a day (BID) | ORAL | Status: DC
  Administered 2020-01-26 – 2020-01-27 (×2): 0.5 mg via ORAL
  Filled 2020-01-26 (×2): qty 1

## 2020-01-26 MED ORDER — ACETAMINOPHEN 325 MG PO TABS
650.0000 mg | ORAL_TABLET | Freq: Four times a day (QID) | ORAL | Status: DC | PRN
  Administered 2020-01-27 (×2): 650 mg via ORAL
  Filled 2020-01-26 (×2): qty 2

## 2020-01-26 MED ORDER — ENOXAPARIN SODIUM 80 MG/0.8ML ~~LOC~~ SOLN
70.0000 mg | Freq: Once | SUBCUTANEOUS | Status: AC
  Administered 2020-01-26: 70 mg via SUBCUTANEOUS
  Filled 2020-01-26: qty 0.8

## 2020-01-26 MED ORDER — CYCLOBENZAPRINE HCL 10 MG PO TABS
5.0000 mg | ORAL_TABLET | Freq: Once | ORAL | Status: AC
  Administered 2020-01-26: 5 mg via ORAL
  Filled 2020-01-26: qty 1

## 2020-01-26 MED ORDER — ACETAMINOPHEN 325 MG PO TABS
650.0000 mg | ORAL_TABLET | Freq: Once | ORAL | Status: AC
  Administered 2020-01-26: 650 mg via ORAL
  Filled 2020-01-26: qty 2

## 2020-01-26 MED ORDER — ENOXAPARIN SODIUM 100 MG/ML ~~LOC~~ SOLN
100.0000 mg | SUBCUTANEOUS | Status: DC
  Administered 2020-01-27: 100 mg via SUBCUTANEOUS
  Filled 2020-01-26: qty 1

## 2020-01-26 NOTE — ED Notes (Signed)
Pt ambulatory to toilet with steady gait noted. Pt then taken to CT.

## 2020-01-26 NOTE — ED Provider Notes (Signed)
[facility] Emergency Department Provider Note ____________________________________________   First MD Initiated Contact with Patient 01/26/20 1454     (approximate)  I have reviewed the triage vital signs and the nursing notes.   HISTORY  Chief Complaint Fever  HPI Candace Randall is a 39 y.o. female presents to the emergency department for treatment and evaluation of fever, tachycardia, back pain and palpitations.  Back pain and palpitations started approximately 10 days ago after moving a refrigerator.  She denies history of palpitations or cardiac disease.  She does have a history of anxiety but states that she has not been anxious about anything and does not feel this is the cause of her symptoms. She just completed a prednisone taper for sinusitis.  Fever started 2 days ago. She had gone to urgent care but was advised to come to the ER for further evaluation.         Past Medical History:  Diagnosis Date  . ADHD   . Anxiety   . Cancer ([place])   . Depression   . Fibromyalgia   . Ovarian cyst   . Thyroid disease     There are no problems to display for this patient.   Past Surgical History:  Procedure Laterality Date  . APPENDECTOMY    . CESAREAN SECTION    . TONSILLECTOMY      Prior to Admission medications   Medication Sig Start Date End Date Taking? Authorizing Provider  clonazePAM (KLONOPIN) 0.5 MG tablet Take 0.5 mg by mouth 2 (two) times daily.    [provider]  levothyroxine (SYNTHROID, LEVOTHROID) 50 MCG tablet Take 50 mcg by mouth daily before breakfast.    [provider]  norethindrone-ethinyl estradiol-iron (ESTROSTEP FE,TILIA FE,TRI-LEGEST FE) 1-20/1-30/1-35 MG-MCG tablet Take 1 tablet by mouth daily.    [provider]  albuterol (VENTOLIN HFA) 108 (90 Base) MCG/ACT inhaler Inhale 2 puffs into the lungs every 6 (six) hours as needed for wheezing. 03/18/19 01/26/20  Marylene Land, NP     Allergies Sulfa antibiotics and Latex  History reviewed. No pertinent family history.  Social History Social History   Tobacco Use  . Smoking status: Current Every Day Smoker    Packs/day: 0.05  . Smokeless tobacco: Never Used  Vaping Use  . Vaping Use: Never used  Substance Use Topics  . Alcohol use: No  . Drug use: No    Review of Systems  Constitutional: Positive for fever/chills Eyes: No visual changes. ENT: No sore throat. Cardiovascular: Positive for chest pain. Respiratory: Denies shortness of breath. Gastrointestinal: No abdominal pain.  No nausea, no vomiting.  No diarrhea.  No constipation. Genitourinary: Negative for dysuria. Musculoskeletal: Positive for back pain. Skin: Negative for rash. Neurological: Negative for headaches, focal weakness or numbness. ____________________________________________   PHYSICAL EXAM:  VITAL SIGNS: ED Triage Vitals  Enc Vitals Group     BP 01/26/20 1249 124/87     Pulse Rate 01/26/20 1249 (!) 124     Resp 01/26/20 1249 (!) 22     Temp 01/26/20 1249 99.9 F (37.7 C)     Temp Source 01/26/20 1249 Oral     SpO2 01/26/20 1249 97 %     Weight 01/26/20 1250 150 lb (68 kg)     Height 01/26/20 1250 5\' 2"  (1.575 m)     Head Circumference --      Peak Flow --      Pain Score 01/26/20 1250 10  Pain Loc --      Pain Edu? --      Excl. in [place]? --     Constitutional: Alert and oriented. Well appearing and in no acute distress. Eyes: Conjunctivae are normal. PERRL. EOMI. Head: Atraumatic. Nose: No congestion/rhinnorhea. Mouth/Throat: Mucous membranes are moist.  Oropharynx non-erythematous. Neck: No stridor.   Hematological/Lymphatic/Immunilogical: No cervical lymphadenopathy. Cardiovascular: Normal rate, regular rhythm. Grossly normal heart sounds.  Good peripheral circulation. Respiratory: Normal respiratory effort.  No retractions. Lungs CTAB. Gastrointestinal: Soft and nontender. No distention. No abdominal  bruits. No CVA tenderness. Genitourinary:  Musculoskeletal: No lower extremity tenderness nor edema.  No joint effusions. Neurologic:  Normal speech and language. No gross focal neurologic deficits are appreciated. No gait instability. Skin:  Skin is warm, dry and intact. No rash noted. Psychiatric: Mood and affect are normal. Speech and behavior are normal.  ____________________________________________   LABS (all labs ordered are listed, but only abnormal results are displayed)  Labs Reviewed  LACTIC ACID, PLASMA - Abnormal; Notable for the following components:      Result Value   Lactic Acid, Venous 2.1 (*)    All other components within normal limits  COMPREHENSIVE METABOLIC PANEL - Abnormal; Notable for the following components:   Sodium 132 (*)    Glucose, Bld 127 (*)    Calcium 8.5 (*)    ALT 51 (*)    All other components within normal limits  CBC WITH DIFFERENTIAL/PLATELET - Abnormal; Notable for the following components:   RBC 5.15 (*)    MCV 77.5 (*)    Basophils Absolute 0.2 (*)    Abs Immature Granulocytes 0.20 (*)    All other components within normal limits  URINALYSIS, COMPLETE (UACMP) WITH MICROSCOPIC - Abnormal; Notable for the following components:   Color, Urine YELLOW (*)    APPearance HAZY (*)    Specific Gravity, Urine 1.003 (*)    Hgb urine dipstick MODERATE (*)    Bacteria, UA MANY (*)    All other components within normal limits  FIBRIN DERIVATIVES D-DIMER ([facility] ONLY) - Abnormal; Notable for the following components:   Fibrin derivatives D-dimer ([facility]) 3,560.58 (*)    All other components within normal limits  SARS CORONAVIRUS 2 BY RT PCR (HOSPITAL ORDER, [place] [facility])  LACTIC ACID, PLASMA  TSH  POC URINE PREG, ED  POCT PREGNANCY, URINE  TROPONIN I (HIGH SENSITIVITY)  TROPONIN I (HIGH SENSITIVITY)   ____________________________________________  EKG  ED ECG REPORT I, Synethia Endicott, FNP-BC personally viewed and  interpreted this ECG.   Date: 01/26/2020  EKG Time: 1241  Rate: 128  Rhythm: sinus tachycardia  Axis: normal  Intervals:none  ST&T Change: no ST elevation  ____________________________________________  RADIOLOGY  ED MD interpretation:    No acute cardiopulmonary abnormality.  CT positive for PE right middle and right lower lobes. Scattered subpleural ground-glass opacities.  I, Sherrie George, personally viewed and evaluated these images (plain radiographs) as part of my medical decision making, as well as reviewing the written report by the radiologist.  Official radiology report(s): DG Chest 2 View  Result Date: 01/26/2020 CLINICAL DATA:  Fever and chest pain.  Symptoms for 3 days. EXAM: CHEST - 2 VIEW COMPARISON:  03/18/2019 FINDINGS: Heart size normal. Lungs are free of focal consolidations. No pleural effusions or pulmonary edema. There is mild prominence of interstitial markings. IMPRESSION: Prominent interstitial markings.  No focal pulmonary abnormality. Electronically Signed   By: Nolon Nations M.D.   On:  01/26/2020 13:34   CT Angio Chest PE W and/or Wo Contrast  Result Date: 01/26/2020 CLINICAL DATA:  Cough, back pain, fever, rule out PE EXAM: CT ANGIOGRAPHY CHEST WITH CONTRAST TECHNIQUE: Multidetector CT imaging of the chest was performed using the standard protocol during bolus administration of intravenous contrast. Multiplanar CT image reconstructions and MIPs were obtained to evaluate the vascular anatomy. CONTRAST:  20mL OMNIPAQUE IOHEXOL 350 MG/ML SOLN COMPARISON:  None. FINDINGS: Cardiovascular: Satisfactory opacification of the pulmonary arteries to the segmental level. Positive examination for pulmonary embolism. There is lobar to subsegmental embolus present in the right middle and right lower lobes. The RV LV ratio is not enlarged, less than 0.9. The main pulmonary artery is normal in caliber. Normal heart size. No pericardial effusion. Mediastinum/Nodes: No  enlarged mediastinal, hilar, or axillary lymph nodes. Thyroid gland, trachea, and esophagus demonstrate no significant findings. Lungs/Pleura: There are scattered subpleural ground-glass opacities bilaterally (e.g. Series 6, image 34). No pleural effusion or pneumothorax. Upper Abdomen: No acute abnormality.  Hepatic steatosis. Musculoskeletal: No chest wall abnormality. No acute or significant osseous findings. Review of the MIP images confirms the above findings. IMPRESSION: 1. Positive examination for pulmonary embolism. There is lobar to subsegmental embolus present in the right middle and right lower lobes. No CT evidence of right heart strain. 2. There are scattered subpleural ground-glass opacities bilaterally. These are nonspecific and infectious or inflammatory. None of these opacities are distal to pulmonary embolus and not morphologically suspicious for pulmonary infarction. 3. Hepatic steatosis. These results were called by telephone at the time of interpretation on 01/26/2020 at 5:50 pm to Dr. Arta Silence , who verbally acknowledged these results. Electronically Signed   By: Eddie Candle M.D.   On: 01/26/2020 17:58    ____________________________________________   PROCEDURES  Procedure(s) performed (including Critical Care):  Procedures  ____________________________________________   INITIAL IMPRESSION / ASSESSMENT AND PLAN     39 year old female presents to the emergency department for treatment and evaluation after being advised to do so by urgent care.  See HPI for further details.  Plan will be to review labs drawn while awaiting ER room assignment.  She is slightly tachycardic as well and will receive a liter of normal saline.  DIFFERENTIAL DIAGNOSIS  COVID-19, other viral syndrome, musculoskeletal strain, cardiac event, pulmonary embolus  ED COURSE  Chest x-ray shows some prominent interstitial markings but otherwise no focal findings.  Lactic acid is 2.1.  Troponin  is normal at 4.  CBC is reassuring.  She is mildly hyponatremic at 132.  Plan will get the repeat lactic acid, repeat troponin and D-dimer.  D-dimer is positive at 3560.  Dr. Cherylann Banas who is also involved in patient's care has ordered a CTA chest.  CTA of the chest is positive for PE and potential groundglass opacities.  Plan will be to give her Lovenox and get a Covid 19 swab.  Results of the CTA were discussed with the patient who was very upset.  Time spent with patient answering questions.  She seems to feel better.  She is aware that she will be admitted and agrees with the plan.  Hospitalist consult has been entered.   Hospitalist service accepts patient for admission.   ____________________________________________   FINAL CLINICAL IMPRESSION(S) / ED DIAGNOSES  Final diagnoses:  Pulmonary embolus, right [facility])     ED Discharge Orders    None       Lakeva Hollon was evaluated in Emergency Department on 01/26/2020 for the symptoms  described in the history of present illness. She was evaluated in the context of the global COVID-19 pandemic, which necessitated consideration that the patient might be at risk for infection with the SARS-CoV-2 virus that causes COVID-19. Institutional protocols and algorithms that pertain to the evaluation of patients at risk for COVID-19 are in a state of rapid change based on information released by regulatory bodies including the CDC and federal and state organizations. These policies and algorithms were followed during the patient's care in the ED.   Note:  This document was prepared using Dragon voice recognition software and may include unintentional dictation errors.   Victorino Dike, FNP 01/26/20 1916    Arta Silence, MD 01/26/20 2204

## 2020-01-26 NOTE — Progress Notes (Signed)
ANTICOAGULATION CONSULT NOTE - Initial Consult  Pharmacy Consult for enoxaparin Indication: pulmonary embolus s/t DVT  Allergies  Allergen Reactions  . Sulfa Antibiotics     vomit  . Latex Rash    Rash     Patient Measurements: Height: 5\' 2"  (157.5 cm) Weight: 68 kg (150 lb) IBW/kg (Calculated) : 50.1   Vital Signs: Temp: 98.4 F (36.9 C) (07/04 2056) Temp Source: Oral (07/04 2056) BP: 116/75 (07/04 2056) Pulse Rate: 88 (07/04 2056)  Labs: Recent Labs    01/26/20 1301 01/26/20 1521  HGB 13.6  --   HCT 39.9  --   PLT 271  --   CREATININE 0.70  --   TROPONINIHS 4 3    Estimated Creatinine Clearance: 83.7 mL/min (by C-G formula based on SCr of 0.7 mg/dL).   Medical History: Past Medical History:  Diagnosis Date  . ADHD   . Anxiety   . Cancer ([place])   . Depression   . Fibromyalgia   . Ovarian cyst   . Thyroid disease     Medications:  Scheduled:  . amoxicillin-clavulanate  1 tablet Oral Q12H  . clonazePAM  0.5 mg Oral BID  . [START ON 01/27/2020] enoxaparin (LOVENOX) injection  100 mg Subcutaneous Q24H  . [START ON 01/27/2020] levothyroxine  50 mcg Oral Q0600    Assessment: Patient w/ h/o ADHD, anxiety, cancer, depression, thyroid dx admitted w/ fevers, back pain, and tachycardia w/ EKG showing NSR x 2 and non-significant trend in troponin. US shows DVT within left posterior tibial vein and CT chest showing pulmonary embolism w/o R heart strain, CBC WNL, aPTT/INR pending, renal fx WNL. Patient is being started on full dose lovenox for anticoagulation of DVT/PE.  Goal of Therapy:  Monitor platelets by anticoagulation protocol: Yes   Plan:  Patient received a dose of lovenox 70 mg subq x 1 at 1800. Will start lovenox 100 mg subq daily for ease of dosing and decreasing number of shots to patient. Will monitor daily CBC's and BMP for renal function and adjust doses as needed.  Tobie Lords, PharmD, BCPS Clinical Pharmacist 01/26/2020,10:08 PM

## 2020-01-26 NOTE — H&P (Signed)
History and Physical    Marah Park GUY:[id number] DOB: [birth year] DOA: 01/26/2020  PCP: [facility]  Patient coming from: Urgent care  I have personally briefly reviewed patient's old medical records in [place]  Chief Complaint: Fever, worsening back pain  HPI: Candace Randall is a 39 y.o. female with medical history significant for  hypothyroidism, ADHD, anxiety, depression, restless leg syndrome who presents with concerns of fever and worsening back pain.  About a week ago patient moved a heavy refrigerator and began to feel upper back spasm pain as well as left lower leg cramping.  She then began to notice sinus congestion and pressure as well was fever.  She saw her PCP and was told she had viral sinusitis and was given prednisone but no antibiotics.  Initially had some improvement.  However then about 3 days ago she again had a fever of 102.5 after stopping her steroids and continued worsening upper back pain.  She has had 2 negative Covid tests in the past week and has received the Moderna vaccine in the past.   She presented to urgent care today and was noted to be tachycardic and since she is on chronic birth control she was sent to ED for evaluation of pulmonary embolism.  Patient reports that she has been on the same birth control for 13 years.  She endorsed smoking history of about 5 cigarettes/day.  No personal family history of blood clots or blood disorders.  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  Denies any chest pain, palpitation or shortness of breath.  No nausea, vomiting or diarrhea.  In the ED, she had temp of 99.16F and initially tachycardic normotensive on room air.  CT notable for lobar to subsegmental embolus in the right middle and right lower lobe.  No CT evidence of right heart strain.  There is also scattered subpleural groundglass opacity bilaterally. No leukocytosis or  anemia.  Lactate initially of 2.1 down to 1.3.  Troponin of 4 and 3.  She was given Tylenol, 1 L of normal saline bolus and started on Lovenox.  Review of Systems:  Constitutional: No Weight Change, + Fever ENT/Mouth: No sore throat, No Rhinorrhea Eyes: No Eye Pain, No Vision Changes Cardiovascular: No Chest Pain, no SOB,No Edema, No Palpitations Respiratory: No Cough, No Sputum Gastrointestinal: No Nausea, No Vomiting, No Diarrhea, No Constipation, No Pain Genitourinary: no Urinary Incontinence Musculoskeletal: No Arthralgias, + Myalgias Skin: No Skin Lesions, No Pruritus, Neuro: no Weakness, No Numbness Psych: No Anxiety/Panic, No Depression, no decrease appetite Heme/Lymph: No Bruising, No Bleeding  Past Medical History:  Diagnosis Date  . ADHD   . Anxiety   . Cancer ([place])   . Depression   . Fibromyalgia   . Ovarian cyst   . Thyroid disease     Past Surgical History:  Procedure Laterality Date  . APPENDECTOMY    . CESAREAN SECTION    . TONSILLECTOMY       reports that she has been smoking. She has been smoking about 0.05 packs per day. She has never used smokeless tobacco. She reports that she does not drink alcohol and does not use drugs.  Allergies  Allergen Reactions  . Sulfa Antibiotics     vomit  . Latex Rash    Rash       Prior to Admission medications   Medication Sig Start Date End Date Taking? Authorizing Provider  Baclofen  5 MG TABS Take 1 tablet by mouth 3 (three) times daily. 01/13/20  Yes [provider]  clonazePAM (KLONOPIN) 0.5 MG tablet Take 0.5 mg by mouth 2 (two) times daily.   Yes [provider]  ESTARYLLA 0.25-35 MG-MCG tablet Take 1 tablet by mouth daily. 12/18/19  Yes [provider]  levothyroxine (SYNTHROID, LEVOTHROID) 50 MCG tablet Take 50 mcg by mouth daily before breakfast.   Yes [provider]  traMADol (ULTRAM) 50 MG tablet 1-2 Tablet(s) By Mouth 4 Times Daily PRN   Yes [provider]  WAL-ITIN D 24 HOUR 10-240 MG 24 hr tablet Take 1 tablet by mouth daily. 01/18/20  Yes [provider]  albuterol (VENTOLIN HFA) 108 (90 Base) MCG/ACT inhaler Inhale 2 puffs into the lungs every 6 (six) hours as needed for wheezing. 03/18/19 01/26/20  Marylene Land, NP    Physical Exam: Vitals:   01/26/20 1616 01/26/20 1630 01/26/20 1700 01/26/20 1934  BP: 113/72 112/72 119/75 117/81  Pulse: 89 85 83 81  Resp:   16 18  Temp:      TempSrc:      SpO2: 98% 97% 94% 96%  Weight:      Height:        Constitutional: NAD, calm, comfortable, anxious and tearful young female sitting at 45 degree angle in bed Vitals:   01/26/20 1616 01/26/20 1630 01/26/20 1700 01/26/20 1934  BP: 113/72 112/72 119/75 117/81  Pulse: 89 85 83 81  Resp:   16 18  Temp:      TempSrc:      SpO2: 98% 97% 94% 96%  Weight:      Height:       Eyes: PERRL, lids and conjunctivae normal ENMT: Mucous membranes are moist.  Right-sided maxillary sinus tenderness with palpation, normal right external auditory canal and tympanic membrane but patient has pain with tugging of the pinna.  No sinus tenderness on the left side with clear external auditory canal and tympanic membrane. Neck: normal, supple Respiratory: clear to auscultation bilaterally, no wheezing, no crackles. Normal respiratory effort. No accessory muscle use.  Cardiovascular: Borderline sinus tachycardia, no murmurs / rubs / gallops. No extremity edema. 2+ pedal pulses.   Abdomen: no tenderness, no masses palpated. Bowel sounds positive.  Musculoskeletal: no clubbing / cyanosis. No joint deformity upper and lower extremities. Good ROM, no contractures. Normal muscle tone.  Back: Pain to palpation of the upper mid back and paraspinal musculature.  No obvious deformities or step-off.  No erythema. Skin: no rashes, lesions, ulcers. No induration Neurologic: CN 2-12 grossly intact. Sensation intact, Strength 5/5 in all 4.  Psychiatric: Normal judgment  and insight. Alert and oriented x 3.  Anxious and tearful mood regarding new diagnosis of PE.   Labs on Admission: I have personally reviewed following labs and imaging studies  CBC: Recent Labs  Lab 01/26/20 1301  WBC 10.2  NEUTROABS 5.9  HGB 13.6  HCT 39.9  MCV 77.5*  PLT 672   Basic Metabolic Panel: Recent Labs  Lab 01/26/20 1301  NA 132*  K 3.5  CL 98  CO2 25  GLUCOSE 127*  BUN 11  CREATININE 0.70  CALCIUM 8.5*   GFR: Estimated Creatinine Clearance: 83.7 mL/min (by C-G formula based on SCr of 0.7 mg/dL). Liver Function Tests: Recent Labs  Lab 01/26/20 1301  AST 37  ALT 51*  ALKPHOS 103  BILITOT 0.4  PROT 7.2  ALBUMIN 3.7   No results for input(s): LIPASE,  AMYLASE in the last 168 hours. No results for input(s): AMMONIA in the last 168 hours. Coagulation Profile: No results for input(s): INR, PROTIME in the last 168 hours. Cardiac Enzymes: No results for input(s): CKTOTAL, CKMB, CKMBINDEX, TROPONINI in the last 168 hours. BNP (last 3 results) No results for input(s): PROBNP in the last 8760 hours. HbA1C: No results for input(s): HGBA1C in the last 72 hours. CBG: No results for input(s): GLUCAP in the last 168 hours. Lipid Profile: No results for input(s): CHOL, HDL, LDLCALC, TRIG, CHOLHDL, LDLDIRECT in the last 72 hours. Thyroid Function Tests: Recent Labs    01/26/20 1701  TSH 1.654   Anemia Panel: No results for input(s): VITAMINB12, FOLATE, FERRITIN, TIBC, IRON, RETICCTPCT in the last 72 hours. Urine analysis:    Component Value Date/Time   COLORURINE YELLOW (A) 01/26/2020 1521   APPEARANCEUR HAZY (A) 01/26/2020 1521   APPEARANCEUR HAZY 03/02/2013 1118   LABSPEC 1.003 (L) 01/26/2020 1521   LABSPEC 1.005 03/02/2013 1118   PHURINE 6.0 01/26/2020 1521   GLUCOSEU NEGATIVE 01/26/2020 1521   GLUCOSEU NEGATIVE 03/02/2013 1118   HGBUR MODERATE (A) 01/26/2020 1521   BILIRUBINUR NEGATIVE 01/26/2020 1521   BILIRUBINUR NEGATIVE 03/02/2013 1118    KETONESUR NEGATIVE 01/26/2020 1521   PROTEINUR NEGATIVE 01/26/2020 1521   NITRITE NEGATIVE 01/26/2020 1521   LEUKOCYTESUR NEGATIVE 01/26/2020 1521   LEUKOCYTESUR TRACE 03/02/2013 1118    Radiological Exams on Admission: DG Chest 2 View  Result Date: 01/26/2020 CLINICAL DATA:  Fever and chest pain.  Symptoms for 3 days. EXAM: CHEST - 2 VIEW COMPARISON:  03/18/2019 FINDINGS: Heart size normal. Lungs are free of focal consolidations. No pleural effusions or pulmonary edema. There is mild prominence of interstitial markings. IMPRESSION: Prominent interstitial markings.  No focal pulmonary abnormality. Electronically Signed   By: Nolon Nations M.D.   On: 01/26/2020 13:34   CT Angio Chest PE W and/or Wo Contrast  Result Date: 01/26/2020 CLINICAL DATA:  Cough, back pain, fever, rule out PE EXAM: CT ANGIOGRAPHY CHEST WITH CONTRAST TECHNIQUE: Multidetector CT imaging of the chest was performed using the standard protocol during bolus administration of intravenous contrast. Multiplanar CT image reconstructions and MIPs were obtained to evaluate the vascular anatomy. CONTRAST:  35mL OMNIPAQUE IOHEXOL 350 MG/ML SOLN COMPARISON:  None. FINDINGS: Cardiovascular: Satisfactory opacification of the pulmonary arteries to the segmental level. Positive examination for pulmonary embolism. There is lobar to subsegmental embolus present in the right middle and right lower lobes. The RV LV ratio is not enlarged, less than 0.9. The main pulmonary artery is normal in caliber. Normal heart size. No pericardial effusion. Mediastinum/Nodes: No enlarged mediastinal, hilar, or axillary lymph nodes. Thyroid gland, trachea, and esophagus demonstrate no significant findings. Lungs/Pleura: There are scattered subpleural ground-glass opacities bilaterally (e.g. Series 6, image 34). No pleural effusion or pneumothorax. Upper Abdomen: No acute abnormality.  Hepatic steatosis. Musculoskeletal: No chest wall abnormality. No acute or  significant osseous findings. Review of the MIP images confirms the above findings. IMPRESSION: 1. Positive examination for pulmonary embolism. There is lobar to subsegmental embolus present in the right middle and right lower lobes. No CT evidence of right heart strain. 2. There are scattered subpleural ground-glass opacities bilaterally. These are nonspecific and infectious or inflammatory. None of these opacities are distal to pulmonary embolus and not morphologically suspicious for pulmonary infarction. 3. Hepatic steatosis. These results were called by telephone at the time of interpretation on 01/26/2020 at 5:50 pm to Dr. Arta Silence , who verbally acknowledged  these results. Electronically Signed   By: Eddie Candle M.D.   On: 01/26/2020 17:58      Assessment/Plan  Acute provoked right sided pulmonary embolism Patient on chronic birth control, recent burst of steroids and has chronic tobacco use as risk factors for her PE. No CT evidence of right heart strain. Continue Lovenox with transition to oral anticoagulation in the morning Also obtain left lower extremity ultrasound due to complaints of calf cramping  Right-sided bacterial sinusitis Start daily Augmentin  Upper back pain/muscle spasm Likely muscle skeletal related.  Will trial Flexeril. Place K pad  Hypothyroidism Continue levothyroxine  anxiety Reassured patient about her anxious about her diagnosis of PE Continue home antianxiolytic  Tobacco use Counseled succession especially in light of new PE  DVT prophylaxis:.Lovenox treatment dose Code Status: Full Family Communication: Plan discussed with patient at bedside  disposition Plan: Home with at least 2 midnight stays  Consults called:  Admission status: inpatient      Status is: Inpatient  Remains inpatient appropriate because:IV treatments appropriate due to intensity of illness or inability to take PO   Dispo: The patient is from: Home               Anticipated d/c is to: Home              Anticipated d/c date is: 2 days              Patient currently is not medically stable to d/c.         Orene Desanctis DO [facility]   If 7PM-7AM, please contact night-coverage [email or web address]   01/26/2020, 7:44 PM

## 2020-01-26 NOTE — ED Notes (Signed)
Admitting provider at bedside. Pt states she is a little anxious about the blood clot. Admitting provider made aware. Pt sitting in room resting.

## 2020-01-26 NOTE — ED Triage Notes (Signed)
Here for pain from neck to calves.  Has had fever 102.5.  Took motrin 1045 today. Sent from [facility] for abnormal EKG.  No cough.  Has felt bad through week. Treated for sinus infection via PCP with prednisone. Felt better while on it but Friday sx came back.  C/o pain to left chest. Pain described as "someone squeezing my left breast".

## 2020-01-26 NOTE — ED Provider Notes (Signed)
[facility]    CSN: [id number] Arrival date & time: 01/26/20  1129      History   Chief Complaint Chief Complaint  Patient presents with   Back Pain   Cough   Otalgia    HPI Candace Randall is a 39 y.o. female. who presents with R ear pain, fever of 102 this am, aches from neck to lower back and palpitasions since yesterday. She has been sick for the past week and saw her PCP who prescribed her prednisone for [facility] and sinus pain and she did great and felt better. Yesterday had sudden worsening of nose congestion, ear pain, and today felt worse feeling her heart is racing off and on and get SOB with this, but no chest pain. She finished the prednisone 4 days ago. Has not taken sudafed. She has taken her thyroid and ADD med this am.  Her last thyroid check was 2 months ago and was normal.  She took Ibuprofen this at around 10:45, she had negative covid test when seen last week and has had the Covid injection. She denies long car rides or flights in the past month or has calf pain.     Past Medical History:  Diagnosis Date   ADHD    Anxiety    Cancer ([place])    Depression    Fibromyalgia    Ovarian cyst    Thyroid disease     There are no problems to display for this patient.   Past Surgical History:  Procedure Laterality Date   APPENDECTOMY     CESAREAN SECTION     TONSILLECTOMY      OB History   No obstetric history on file.      Home Medications    Prior to Admission medications   Medication Sig Start Date End Date Taking? Authorizing Provider  clonazePAM (KLONOPIN) 0.5 MG tablet Take 0.5 mg by mouth 2 (two) times daily.   Yes [provider]  levothyroxine (SYNTHROID, LEVOTHROID) 50 MCG tablet Take 50 mcg by mouth daily before breakfast.   Yes [provider]  norethindrone-ethinyl estradiol-iron (ESTROSTEP FE,TILIA FE,TRI-LEGEST FE) 1-20/1-30/1-35 MG-MCG tablet Take 1 tablet by mouth daily.   Yes [provider]  albuterol (VENTOLIN HFA) 108 (90 Base) MCG/ACT inhaler Inhale 2 puffs into the lungs every 6 (six) hours as needed for wheezing. 03/18/19 01/26/20  Marylene Land, NP    Family History History reviewed. No pertinent family history.  Social History Social History   Tobacco Use   Smoking status: Current Every Day Smoker    Packs/day: 0.05   Smokeless tobacco: Never Used  Vaping Use   Vaping Use: Never used  Substance Use Topics   Alcohol use: No   Drug use: No     Allergies   Sulfa antibiotics and Latex   Review of Systems Review of Systems  Constitutional: Positive for appetite change, chills, diaphoresis, fatigue and fever. Negative for activity change.  HENT: Positive for congestion, ear pain, postnasal drip, rhinorrhea and sinus pressure. Negative for ear discharge, facial swelling, sore throat and trouble swallowing.   Eyes: Negative for discharge.  Respiratory: Positive for cough and shortness of breath. Negative for chest tightness and wheezing.   Cardiovascular: Positive for palpitations. Negative for chest pain and leg swelling.  Gastrointestinal: Negative for abdominal pain, diarrhea, nausea and vomiting.  Genitourinary: Negative for difficulty urinating.  Musculoskeletal: Positive for back pain, myalgias and neck pain. Negative for arthralgias and neck stiffness.  Skin: Negative for rash.  Neurological: Positive for headaches.  Hematological: Negative for adenopathy.     Physical Exam Triage Vital Signs ED Triage Vitals  Enc Vitals Group     BP 01/26/20 1146 (!) 141/92     Pulse Rate 01/26/20 1146 (!) 130     Resp 01/26/20 1146 14     Temp 01/26/20 1146 98.2 F (36.8 C)     Temp Source 01/26/20 1146 Oral     SpO2 01/26/20 1146 99 %     Weight 01/26/20 1143 150 lb (68 kg)     Height 01/26/20 1143 5\' 2"  (1.575 m)     Head Circumference --      Peak Flow --      Pain Score 01/26/20 1143 10     Pain Loc --      Pain Edu? --       Excl. in [place]? --    No data found.  Updated Vital Signs BP (!) 141/92 (BP Location: Left Arm)    Pulse (!) 130    Temp 98.2 F (36.8 C) (Oral)    Resp 14    Ht 5\' 2"  (1.575 m)    Wt 150 lb (68 kg)    LMP 01/12/2020 (Approximate)    SpO2 99%    BMI 27.44 kg/m   Visual Acuity Right Eye Distance:   Left Eye Distance:   Bilateral Distance:    Right Eye Near:   Left Eye Near:    Bilateral Near:     Physical Exam   UC Treatments / Results  Labs (all labs ordered are listed, but only abnormal results are displayed) Labs Reviewed  SARS CORONAVIRUS 2 (TAT 6-24 HRS)    EKG This was reviewed Is abnormal showing some Q waves  Radiology No results found.  Procedures Procedures (including critical care time)  Medications Ordered in UC Medications - No data to display  Initial Impression / Assessment and Plan / UC Course  I have reviewed the triage vital signs and the nursing notes. Pertinent labs  results that were available during my care of the patient were reviewed by me and considered in my medical decision making (see chart for details). She is tachycardic with SOB but no CP and does not have a fever, so I sent her to ER for further work up. She is on birth control with PE needs to be ruled out. I reviewed this case with Dr Zenda Alpers and pt will be driven to ER by her father who is here in the lobby.  I attempted to contact ER but the phone rang for a long time without anyone answering.   Final Clinical Impressions(s) / UC Diagnoses   Final diagnoses:  Viral URI with cough  Acute mucoid otitis media of right ear  Nonspecific abnormal electrocardiogram (ECG) (EKG)  Shortness of breath  Rapid heart beat     Discharge Instructions     We are sending you to the ER to have more test which we cant do here. Make sure your dad drives you to [place] ER, you should not drive.     ED Prescriptions    None     PDMP not reviewed this encounter.   Shelby Mattocks, [place] 01/26/20 1228

## 2020-01-26 NOTE — Discharge Instructions (Signed)
We are sending you to the ER to have more test which we cant do here. Make sure your dad drives you to [place] ER, you should not drive.

## 2020-01-26 NOTE — ED Notes (Signed)
Pt taken to floor with nurse tech on stretcher, with pts personal belongings, on monitor.

## 2020-01-26 NOTE — ED Triage Notes (Signed)
Patient states that she was started on Prednisone for sinus pressure.  Patient states that she had sinus congestion, cough, back pain, and fever since Monday.  Patient states that she took Ibuprofen around 1045 this morning.  Patient was tested for COVID and was Negative.  Patient states that she is COVID vaccinated.

## 2020-01-26 NOTE — ED Notes (Addendum)
Pt states she has had fever, facial pressure, and back ache. States saw her doctor and was told she had viral sinus infection and provided prednisone. Finished Thursday, was feeling better but symptoms started again Friday. Temp of 102.5 last night. Been taking OTC meds at home. Thinks back pain might be from moving a fridge. A&O, ambulatory. No distress noted. Was sent to ER from UC for "possible blood clot in my lung." denies SOB or recent long travel.

## 2020-01-26 NOTE — ED Notes (Signed)
Pt ambulatory to toilet with steady gait. Provided urine specimen cup.

## 2020-01-27 DIAGNOSIS — J329 Chronic sinusitis, unspecified: Secondary | ICD-10-CM

## 2020-01-27 DIAGNOSIS — F419 Anxiety disorder, unspecified: Secondary | ICD-10-CM

## 2020-01-27 DIAGNOSIS — I82402 Acute embolism and thrombosis of unspecified deep veins of left lower extremity: Secondary | ICD-10-CM

## 2020-01-27 DIAGNOSIS — I2694 Multiple subsegmental pulmonary emboli without acute cor pulmonale: Secondary | ICD-10-CM

## 2020-01-27 DIAGNOSIS — B9689 Other specified bacterial agents as the cause of diseases classified elsewhere: Secondary | ICD-10-CM

## 2020-01-27 LAB — BASIC METABOLIC PANEL
Anion gap: 7 (ref 5–15)
BUN: 9 mg/dL (ref 6–20)
CO2: 28 mmol/L (ref 22–32)
Calcium: 8.5 mg/dL — ABNORMAL LOW (ref 8.9–10.3)
Chloride: 101 mmol/L (ref 98–111)
Creatinine, Ser: 0.69 mg/dL (ref 0.44–1.00)
GFR calc Af Amer: >60 mL/min (ref >60)
GFR calc non Af Amer: >60 mL/min (ref >60)
Glucose, Bld: 109 mg/dL — ABNORMAL HIGH (ref 70–99)
Potassium: 3.6 mmol/L (ref 3.5–5.1)
Sodium: 136 mmol/L (ref 135–145)

## 2020-01-27 LAB — CBC
HCT: 38.1 % (ref 36.0–46.0)
Hemoglobin: 13.1 g/dL (ref 12.0–15.0)
MCH: 26.6 pg (ref 26.0–34.0)
MCHC: 34.4 g/dL (ref 30.0–36.0)
MCV: 77.3 fL — ABNORMAL LOW (ref 80.0–100.0)
Platelets: 255 10*3/uL (ref 150–400)
RBC: 4.93 MIL/uL (ref 3.87–5.11)
RDW: 13.4 % (ref 11.5–15.5)
WBC: 8.7 10*3/uL (ref 4.0–10.5)
nRBC: 0 % (ref 0.0–0.2)

## 2020-01-27 LAB — HIV ANTIBODY (ROUTINE TESTING W REFLEX): HIV Screen 4th Generation wRfx: NONREACTIVE

## 2020-01-27 MED ORDER — APIXABAN 5 MG PO TABS: 10.0000 mg | ORAL_TABLET | Freq: Two times a day (BID) | ORAL | Status: DC

## 2020-01-27 MED ORDER — APIXABAN 5 MG PO TABS: 5.0000 mg | ORAL_TABLET | Freq: Two times a day (BID) | ORAL | Status: DC

## 2020-01-27 MED ORDER — AMOXICILLIN-POT CLAVULANATE 875-125 MG PO TABS: 1.0000 | ORAL_TABLET | Freq: Two times a day (BID) | ORAL | 0 refills | Status: AC

## 2020-01-27 MED ORDER — APIXABAN 5 MG PO TABS: 10.0000 mg | ORAL_TABLET | Freq: Two times a day (BID) | ORAL | 4 refills | Status: AC

## 2020-01-27 NOTE — Discharge Instructions (Signed)
Pt advised to call and make appt with her OB-GYN to discuss her contraception. She is asked to hold her BC pills till she sees her GYN Advise smoking cessation

## 2020-01-27 NOTE — Consult Note (Signed)
ANTICOAGULATION CONSULT NOTE - Initial Consult  Pharmacy Consult for Eliquis Indication: pulmonary embolus s/t DVT  Allergies  Allergen Reactions  . Sulfa Antibiotics     vomit  . Latex Rash    Rash     Patient Measurements: Height: 5\' 2"  (157.5 cm) Weight: 68 kg (150 lb) IBW/kg (Calculated) : 50.1  Vital Signs: Temp: 98 F (36.7 C) (07/05 0550) Temp Source: Oral (07/05 0550) BP: 108/68 (07/05 0550) Pulse Rate: 94 (07/05 0550)  Labs: Recent Labs    01/26/20 1301 01/26/20 1521 01/26/20 2221 01/27/20 0452  HGB 13.6  --   --  13.1  HCT 39.9  --   --  38.1  PLT 271  --   --  255  APTT  --   --  36  --   LABPROT  --   --  13.7  --   INR  --   --  1.1  --   CREATININE 0.70  --   --  0.69  TROPONINIHS 4 3  --   --     Estimated Creatinine Clearance: 83.7 mL/min (by C-G formula based on SCr of 0.69 mg/dL).   Medical History: Past Medical History:  Diagnosis Date  . ADHD   . Anxiety   . Cancer ([place])   . Depression   . Fibromyalgia   . Ovarian cyst   . Thyroid disease     Medications:  Last Lovenox 100mg  dose 7/5 0534  Assessment: Patient w/ h/o ADHD, anxiety, cancer, depression, thyroid dx admitted w/ fevers, back pain, and tachycardia w/ EKG showing NSR x 2 and non-significant trend in troponin. US shows DVT within left posterior tibial vein and CT chest showing pulmonary embolism w/o R heart strain, CBC WNL, aPTT/INR pending, renal fx WNL. Patient was started on full dose lovenox 100mg  q24 for anticoagulation of DVT/PE.   Goal of Therapy:  Monitor platelets by anticoagulation protocol: Yes   Plan:  Will start Eliquis 10mg  bid x 7 days, followed by Eliquis 5mg  bid thereafter.  First dose not to be given until bedtime this evening due to proximity to morning therapeutic lovenox injection as stated above.  Pharmacy will counsel appropriate medication use at bedside per protocol for oral anticoagulation.  Lu Duffel, PharmD, BCPS Clinical  Pharmacist 01/27/2020 10:43 AM

## 2020-01-27 NOTE — TOC Initial Note (Signed)
Transition of Care [facility]) - Initial/Assessment Note    Patient Details  Name: Candace Randall MRN: [id number] Date of Birth: [birth year]  Transition of Care [facility]) CM/SW Contact:    Trenton Founds, RN Phone Number: 01/27/2020, 12:13 PM  Clinical Narrative:    RNCM met with patient at bedside. Patient is alert and sitting up in bed, reports to feeling some better but still have pain in her leg. Discussed with patient reason for CM visit and that assistance with medications can be discussed. Patient reports that she gets her medications at [facility] and normally can afford her medications. Discussed with patient that new medication for blood clot is likely to be very expensive. Patient is agreeable to being set up with med management clinic and reports she has never gotten medication from there.   Update: Pharmacist is going to get first 30 days of Eliquis from employee pharmacy and deliver to patient and then they will transfer script over to [facility] Management clinic. Delivered application for Med Management to patient and gave instructions on how to fill out and turn into pharmacy.             Expected Discharge Plan: Home/Self Care Barriers to Discharge: Barriers Resolved   Patient Goals and CMS Choice        Expected Discharge Plan and Services Expected Discharge Plan: Home/Self Care       Living arrangements for the past 2 months: Single Family Home Expected Discharge Date: 01/27/20                                    Prior Living Arrangements/Services Living arrangements for the past 2 months: Single Family Home Lives with:: Spouse Patient language and need for interpreter reviewed:: Yes        Need for Family Participation in Patient Care: No (Comment) Care giver support system in place?: No (comment)   Criminal Activity/Legal Involvement Pertinent to Current Situation/Hospitalization: No - Comment as needed  Activities of Daily Living Home Assistive  Devices/Equipment: None ADL Screening (condition at time of admission) Patient's cognitive ability adequate to safely complete daily activities?: Yes Is the patient deaf or have difficulty hearing?: No Does the patient have difficulty seeing, even when wearing glasses/contacts?: No Does the patient have difficulty concentrating, remembering, or making decisions?: No Patient able to express need for assistance with ADLs?: Yes Does the patient have difficulty dressing or bathing?: No Independently performs ADLs?: Yes (appropriate for developmental age) Does the patient have difficulty walking or climbing stairs?: No Weakness of Legs: None Weakness of Arms/Hands: None  Permission Sought/Granted                  Emotional Assessment Appearance:: Appears stated age Attitude/Demeanor/Rapport: Engaged, Gracious Affect (typically observed): Appropriate, Accepting Orientation: : Oriented to Self, Oriented to Place, Oriented to  Time, Oriented to Situation Alcohol / Substance Use: Not Applicable Psych Involvement: No (comment)  Admission diagnosis:  Pulmonary embolism (HCC) [I26.99] Pulmonary embolus, right (HCC) [I26.99] Patient Active Problem List   Diagnosis Date Noted  . Pulmonary embolism (HCC) 01/26/2020  . Bacterial sinusitis 01/26/2020  . Muscle spasm 01/26/2020  . Hypothyroid 01/26/2020  . Anxiety 01/26/2020  . Tobacco use 01/26/2020   PCP:  [facility], [place] [facility]:   [id number] - [place], [place] - [address] [place] [address] [place]  [place] [address] Phone: [phone] Fax: [phone]  [id number] [id number]: [phone]: [phone] [id number]: [phone]: [phone]    Social Determinants of Health (SDOH)  Interventions    Readmission Risk Interventions No flowsheet data found.

## 2020-01-27 NOTE — Discharge Summary (Signed)
Candace Randall NAME: Candace Randall    MR#:  [id number]  DATE OF BIRTH:  1980/11/09  DATE OF ADMISSION:  01/26/2020 ADMITTING PHYSICIAN: Orene Desanctis, DO  DATE OF DISCHARGE: 01/27/2020  PRIMARY CARE PHYSICIAN: [facility] [place] [facility]    ADMISSION DIAGNOSIS:  Pulmonary embolism ([place]) [I26.99] Pulmonary embolus, right (HCC) [I26.99]  DISCHARGE DIAGNOSIS:  Right subsegmental PE Left LE DVT Acute Sinusitis SECONDARY DIAGNOSIS:   Past Medical History:  Diagnosis Date  . ADHD   . Anxiety   . Cancer ([place])   . Depression   . Fibromyalgia   . Ovarian cyst   . Thyroid disease     HOSPITAL COURSE:   Candace Randall is a 39 y.o. female with medical history significant for  hypothyroidism, ADHD, anxiety, depression, restless leg syndrome who presents with concerns of fever and worsening back pain.She then began to notice sinus congestion and pressure as well was fever.  She saw her PCP and was told she had viral sinusitis and was given prednisone but no antibiotics.  Acute provoked right sided pulmonary embolism -Patient on chronic birth control, recent burst of steroids and has chronic tobacco use as risk factors for her PE. -No CT evidence of right heart strain. -Continue Lovenox with transition to oral anticoagulation in the evening today. -Pharmacist to get Eliquis (30 day ) pills to her room for her to take home and pt will thereafter go to [facility] clinic for her eliquis. -left lower extremity ultrasound  Positive for left popliteal and tibial DVT -sats >92% on RA  Right-sided acute bacterial sinusitis cont daily Augmentin--5 more days  Upper back pain/muscle spasm Likely muscle skeletal related.  prn antispasmodics Place K pad  Hypothyroidism Continue levothyroxine  anxiety Reassured patient about her anxious about her diagnosis of PE Continue home antianxiolytic  Tobacco use Counseled  succession especially in light of new PE/DVT  Oral contraception Pt is advissed to d/w her OB-GYN regarding BC pills. For now with PE and DVT I have asked her to hold her BC pills and use alternate method of contraception if needed. She voiced understanding   DVT prophylaxis:.Lovenox treatment dose Code Status: Full Family Communication: Plan discussed with patient at bedside  disposition Plan: Home today Consults called:  Admission status:observation Medically best at baseline and stable CONSULTS OBTAINED:    DRUG ALLERGIES:   Allergies  Allergen Reactions  . Sulfa Antibiotics     vomit  . Latex Rash    Rash     DISCHARGE MEDICATIONS:   Allergies as of 01/27/2020      Reactions   Sulfa Antibiotics    vomit   Latex Rash   Rash      Medication List    STOP taking these medications   Estarylla 0.25-35 MG-MCG tablet Generic drug: norgestimate-ethinyl estradiol     TAKE these medications   amoxicillin-clavulanate 875-125 MG tablet Commonly known as: AUGMENTIN Take 1 tablet by mouth every 12 (twelve) hours for 5 days.   apixaban 5 MG Tabs tablet Commonly known as: ELIQUIS Take 2 tablets (10 mg total) by mouth 2 (two) times daily. And from 02/03/2020 take 5 mg twice a day   Baclofen 5 MG Tabs Take 1 tablet by mouth 3 (three) times daily.   clonazePAM 0.5 MG tablet Commonly known as: KLONOPIN Take 0.5 mg by mouth 2 (two) times daily.   levothyroxine 50 MCG tablet Commonly known as: SYNTHROID Take 50  mcg by mouth daily before breakfast.   traMADol 50 MG tablet Commonly known as: ULTRAM 1-2 Tablet(s) By Mouth 4 Times Daily PRN   Wal-itin D 24 Hour 10-240 MG 24 hr tablet Generic drug: loratadine-pseudoephedrine Take 1 tablet by mouth daily.       If you experience worsening of your admission symptoms, develop shortness of breath, life threatening emergency, suicidal or homicidal thoughts you must seek medical attention immediately by calling 911 or  calling your MD immediately  if symptoms less severe.  You Must read complete instructions/literature along with all the possible adverse reactions/side effects for all the Medicines you take and that have been prescribed to you. Take any new Medicines after you have completely understood and accept all the possible adverse reactions/side effects.   Please note  You were cared for by a hospitalist during your hospital stay. If you have any questions about your discharge medications or the care you received while you were in the hospital after you are discharged, you can call the unit and asked to speak with the hospitalist on call if the hospitalist that took care of you is not available. Once you are discharged, your primary care physician will handle any further medical issues. Please note that NO REFILLS for any discharge medications will be authorized once you are discharged, as it is imperative that you return to your primary care physician (or establish a relationship with a primary care physician if you do not have one) for your aftercare needs so that they can reassess your need for medications and monitor your lab values. Today   SUBJECTIVE  A bit anxious about her diagnosis--discussed with her in details about it and answered most questions Mild left calf cramping  VITAL SIGNS:  Blood pressure 112/79, pulse 91, temperature 99.2 F (37.3 C), temperature source Oral, resp. rate 18, height 5\' 2"  (1.575 m), weight 68 kg, last menstrual period 01/12/2020, SpO2 97 %.  I/O:    Intake/Output Summary (Last 24 hours) at 01/27/2020 1255 Last data filed at 01/27/2020 1017 Gross per 24 hour  Intake 1360 ml  Output 800 ml  Net 560 ml    PHYSICAL EXAMINATION:  GENERAL:  39 y.o.-year-old patient lying in the bed with no acute distress.  EYES: Pupils equal, round, reactive to light and accommodation. No scleral icterus.  HEENT: Head atraumatic, normocephalic. Oropharynx and nasopharynx clear.   NECK:  Supple, no jugular venous distention. No thyroid enlargement, no tenderness.  LUNGS: Normal breath sounds bilaterally, no wheezing, rales,rhonchi or crepitation. No use of accessory muscles of respiration.  CARDIOVASCULAR: S1, S2 normal. No murmurs, rubs, or gallops.  ABDOMEN: Soft, non-tender, non-distended. Bowel sounds present. No organomegaly or mass.  EXTREMITIES: No pedal edema, cyanosis, or clubbing.  NEUROLOGIC: Cranial nerves II through XII are intact. Muscle strength 5/5 in all extremities. Sensation intact. Gait not checked.  PSYCHIATRIC: The patient is alert and oriented x 3.  SKIN: No obvious rash, lesion, or ulcer.   DATA REVIEW:   CBC  Recent Labs  Lab 01/27/20 0452  WBC 8.7  HGB 13.1  HCT 38.1  PLT 255    Chemistries  Recent Labs  Lab 01/26/20 1301 01/26/20 1301 01/27/20 0452  NA 132*   < > 136  K 3.5   < > 3.6  CL 98   < > 101  CO2 25   < > 28  GLUCOSE 127*   < > 109*  BUN 11   < > 9  CREATININE  0.70   < > 0.69  CALCIUM 8.5*   < > 8.5*  AST 37  --   --   ALT 51*  --   --   ALKPHOS 103  --   --   BILITOT 0.4  --   --    < > = values in this interval not displayed.    Microbiology Results   Recent Results (from the past 240 hour(s))  SARS CORONAVIRUS 2 (TAT 6-24 HRS) Nasopharyngeal Nasopharyngeal Swab     Status: None   Collection Time: 01/26/20 12:16 PM   Specimen: Nasopharyngeal Swab  Result Value Ref Range Status   SARS Coronavirus 2 NEGATIVE NEGATIVE Final    Comment: (NOTE) SARS-CoV-2 target nucleic acids are NOT DETECTED.  The SARS-CoV-2 RNA is generally detectable in upper and lower respiratory specimens during the acute phase of infection. Negative results do not preclude SARS-CoV-2 infection, do not rule out co-infections with other pathogens, and should not be used as the sole basis for treatment or other patient management decisions. Negative results must be combined with clinical observations, patient history, and  epidemiological information. The expected result is Negative.  Fact Sheet for Patients: SugarRoll.be  Fact Sheet for Healthcare Providers: https://www.woods-mathews.com/  This test is not yet approved or cleared by the [place] FDA and  has been authorized for detection and/or diagnosis of SARS-CoV-2 by FDA under an Emergency Use Authorization (EUA). This EUA will remain  in effect (meaning this test can be used) for the duration of the COVID-19 declaration under Se ction 564(b)(1) of the Act, 21 U.S.C. section 360bbb-3(b)(1), unless the authorization is terminated or revoked sooner.  Performed at [facility], [place] [address] [place], [place] [address]   SARS Coronavirus 2 by RT PCR (hospital order, performed in [facility] hospital lab) Nasopharyngeal Nasopharyngeal Swab     Status: None   Collection Time: 01/26/20  6:17 PM   Specimen: Nasopharyngeal Swab  Result Value Ref Range Status   SARS Coronavirus 2 NEGATIVE NEGATIVE Final    Comment: (NOTE) SARS-CoV-2 target nucleic acids are NOT DETECTED.  The SARS-CoV-2 RNA is generally detectable in upper and lower respiratory specimens during the acute phase of infection. The lowest concentration of SARS-CoV-2 viral copies this assay can detect is 250 copies / mL. A negative result does not preclude SARS-CoV-2 infection and should not be used as the sole basis for treatment or other patient management decisions.  A negative result may occur with improper specimen collection / handling, submission of specimen other than nasopharyngeal swab, presence of viral mutation(s) within the areas targeted by this assay, and inadequate number of viral copies (<250 copies / mL). A negative result must be combined with clinical observations, patient history, and epidemiological information.  Fact Sheet for Patients:   StrictlyIdeas.no  Fact Sheet for Healthcare  Providers: BankingDealers.co.za  This test is not yet approved or  cleared by the [place] FDA and has been authorized for detection and/or diagnosis of SARS-CoV-2 by FDA under an Emergency Use Authorization (EUA).  This EUA will remain in effect (meaning this test can be used) for the duration of the COVID-19 declaration under Section 564(b)(1) of the Act, 21 U.S.C. section 360bbb-3(b)(1), unless the authorization is terminated or revoked sooner.  Performed at [facility], [address] [place], [place] [address]     RADIOLOGY:  DG Chest 2 View  Result Date: 01/26/2020 CLINICAL DATA:  Fever and chest pain.  Symptoms for 3 days.  EXAM: CHEST - 2 VIEW COMPARISON:  03/18/2019 FINDINGS: Heart size normal. Lungs are free of focal consolidations. No pleural effusions or pulmonary edema. There is mild prominence of interstitial markings. IMPRESSION: Prominent interstitial markings.  No focal pulmonary abnormality. Electronically Signed   By: Nolon Nations M.D.   On: 01/26/2020 13:34   CT Angio Chest PE W and/or Wo Contrast  Result Date: 01/26/2020 CLINICAL DATA:  Cough, back pain, fever, rule out PE EXAM: CT ANGIOGRAPHY CHEST WITH CONTRAST TECHNIQUE: Multidetector CT imaging of the chest was performed using the standard protocol during bolus administration of intravenous contrast. Multiplanar CT image reconstructions and MIPs were obtained to evaluate the vascular anatomy. CONTRAST:  5mL OMNIPAQUE IOHEXOL 350 MG/ML SOLN COMPARISON:  None. FINDINGS: Cardiovascular: Satisfactory opacification of the pulmonary arteries to the segmental level. Positive examination for pulmonary embolism. There is lobar to subsegmental embolus present in the right middle and right lower lobes. The RV LV ratio is not enlarged, less than 0.9. The main pulmonary artery is normal in caliber. Normal heart size. No pericardial effusion. Mediastinum/Nodes: No enlarged mediastinal, hilar,  or axillary lymph nodes. Thyroid gland, trachea, and esophagus demonstrate no significant findings. Lungs/Pleura: There are scattered subpleural ground-glass opacities bilaterally (e.g. Series 6, image 34). No pleural effusion or pneumothorax. Upper Abdomen: No acute abnormality.  Hepatic steatosis. Musculoskeletal: No chest wall abnormality. No acute or significant osseous findings. Review of the MIP images confirms the above findings. IMPRESSION: 1. Positive examination for pulmonary embolism. There is lobar to subsegmental embolus present in the right middle and right lower lobes. No CT evidence of right heart strain. 2. There are scattered subpleural ground-glass opacities bilaterally. These are nonspecific and infectious or inflammatory. None of these opacities are distal to pulmonary embolus and not morphologically suspicious for pulmonary infarction. 3. Hepatic steatosis. These results were called by telephone at the time of interpretation on 01/26/2020 at 5:50 pm to Dr. Arta Silence , who verbally acknowledged these results. Electronically Signed   By: Eddie Candle M.D.   On: 01/26/2020 17:58   US Venous Img Lower Unilateral Left (DVT)  Result Date: 01/26/2020 CLINICAL DATA:  Pulmonary embolism seen on CT with possible DVT. EXAM: LEFT LOWER EXTREMITY VENOUS DOPPLER ULTRASOUND TECHNIQUE: Gray-scale sonography with compression, as well as color and duplex ultrasound, were performed to evaluate the deep venous system(s) from the level of the common femoral vein through the popliteal and proximal calf veins. COMPARISON:  None. FINDINGS: VENOUS Normal compressibility of the LEFT common femoral, LEFT superficial femoral, and LEFT popliteal veins. Abnormal compressibility of the LEFT posterior tibial vein and LEFT peroneal vein is noted. Visualized portions of the LEFT profunda femoral vein and LEFT great saphenous vein unremarkable. Filling defects are seen within the LEFT posterior tibial vein and LEFT  peroneal vein. These filling defects are not occlusive within the posterior tibial vein and occlusive within the peroneal vein. No filling defects are seen within the remaining venous structures of the LEFT lower extremity on grayscale or color Doppler imaging. Doppler waveforms show abnormal direction of venous flow, abnormal respiratory plasticity and abnormal response to augmentation within the LEFT posterior tibial and LEFT peroneal veins. Limited views of the contralateral common femoral vein are unremarkable. OTHER None. Limitations: none IMPRESSION: Findings consistent with DVT within the LEFT posterior tibial vein (nonocclusive) and LEFT peroneal vein (occlusive. Electronically Signed   By: Virgina Norfolk M.D.   On: 01/26/2020 20:26     CODE STATUS:     Code Status Orders  (  From admission, onward)         Start     Ordered   01/26/20 1936  Full code  Continuous        01/26/20 1938        Code Status History    This patient has a current code status but no historical code status.   Advance Care Planning Activity       TOTAL TIME TAKING CARE OF THIS PATIENT: 40 minutes.    Fritzi Mandes M.D  [facility]    CC: Primary care physician; [facility]

## 2020-01-27 NOTE — Plan of Care (Signed)
Discharge order received. Patient mental status is at baseline. Vital signs stable . No signs of acute distress. Discharge instructions given. Patient verbalized understanding. No other issues noted at this time.   

## 2022-03-20 IMAGING — CR DG CHEST 2V
1 series · 2 of 2 positions shown · non-contrast
Comparison: 03/18/2019

CLINICAL DATA: Fever and chest pain.  Symptoms for 3 days.

EXAM:
CHEST - 2 VIEW

[Series 1: dg chest 2 view · 0.14mm/px · 2 of 2 slices shown]
[im 1/2]
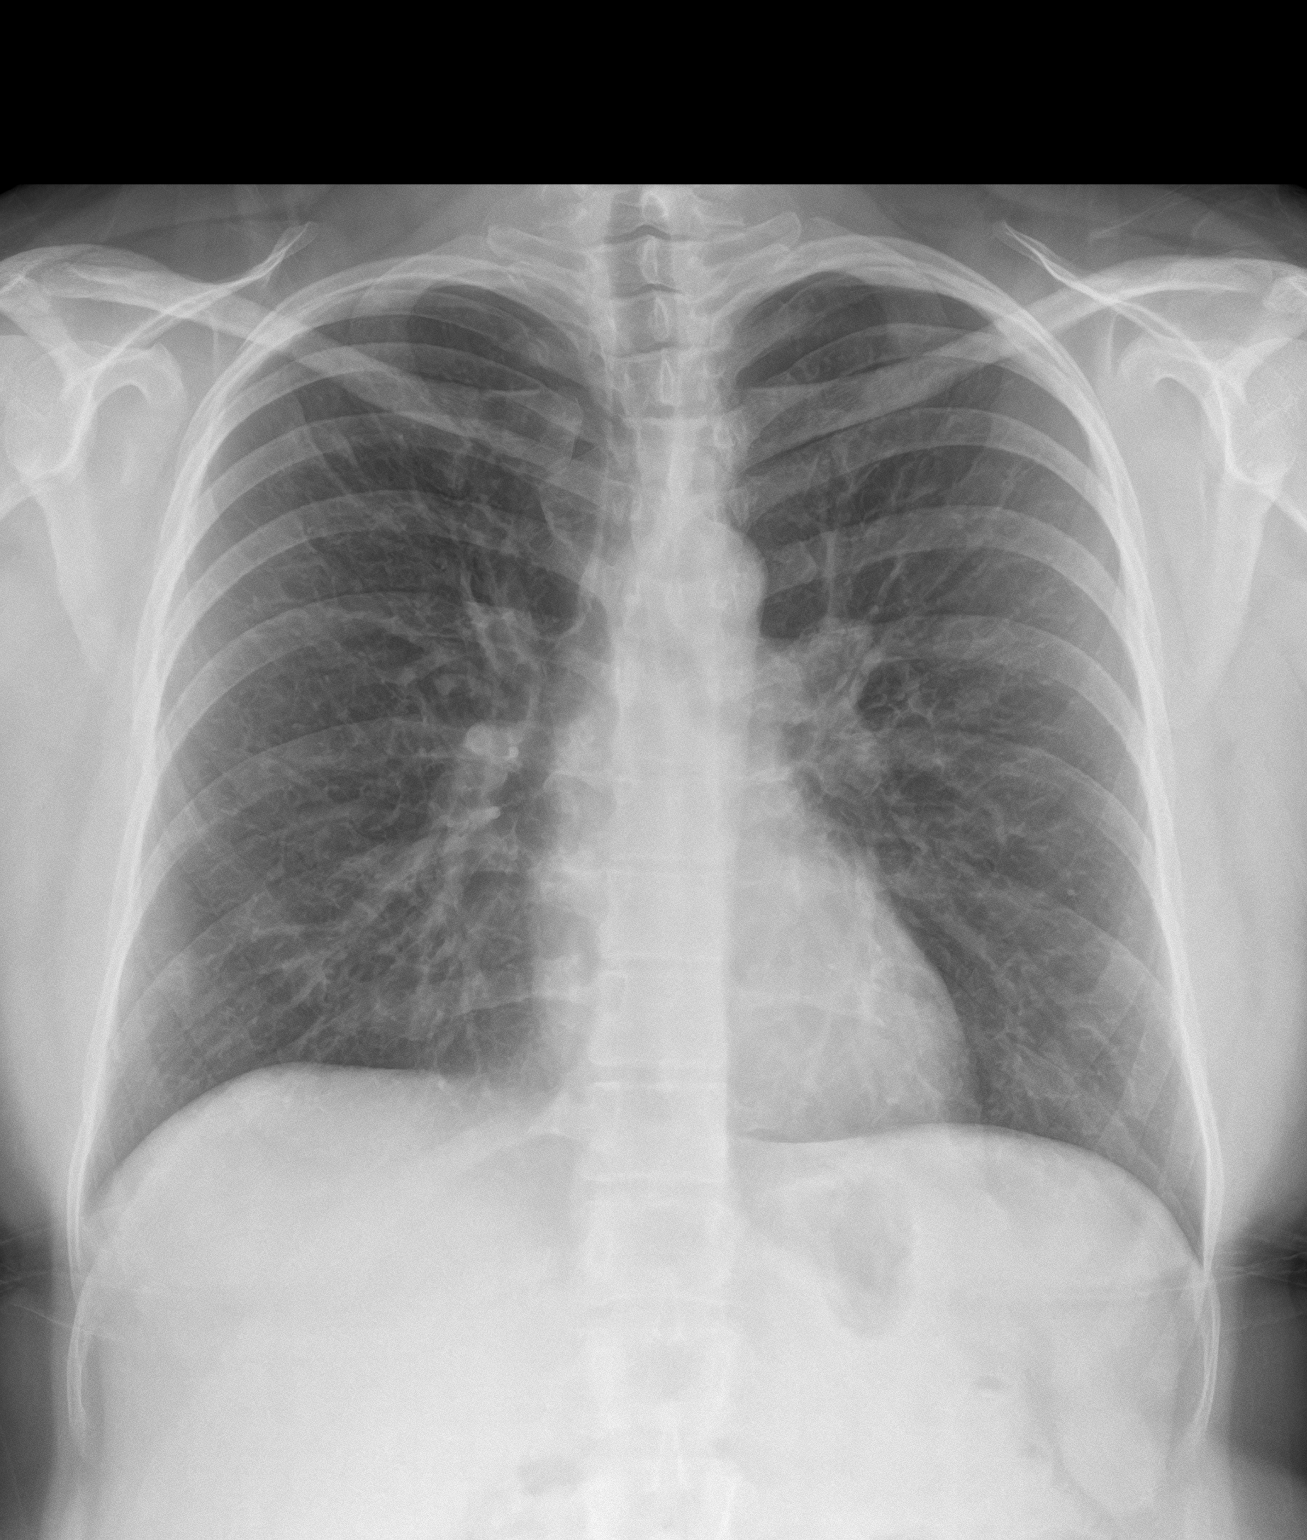
[im 2/2]
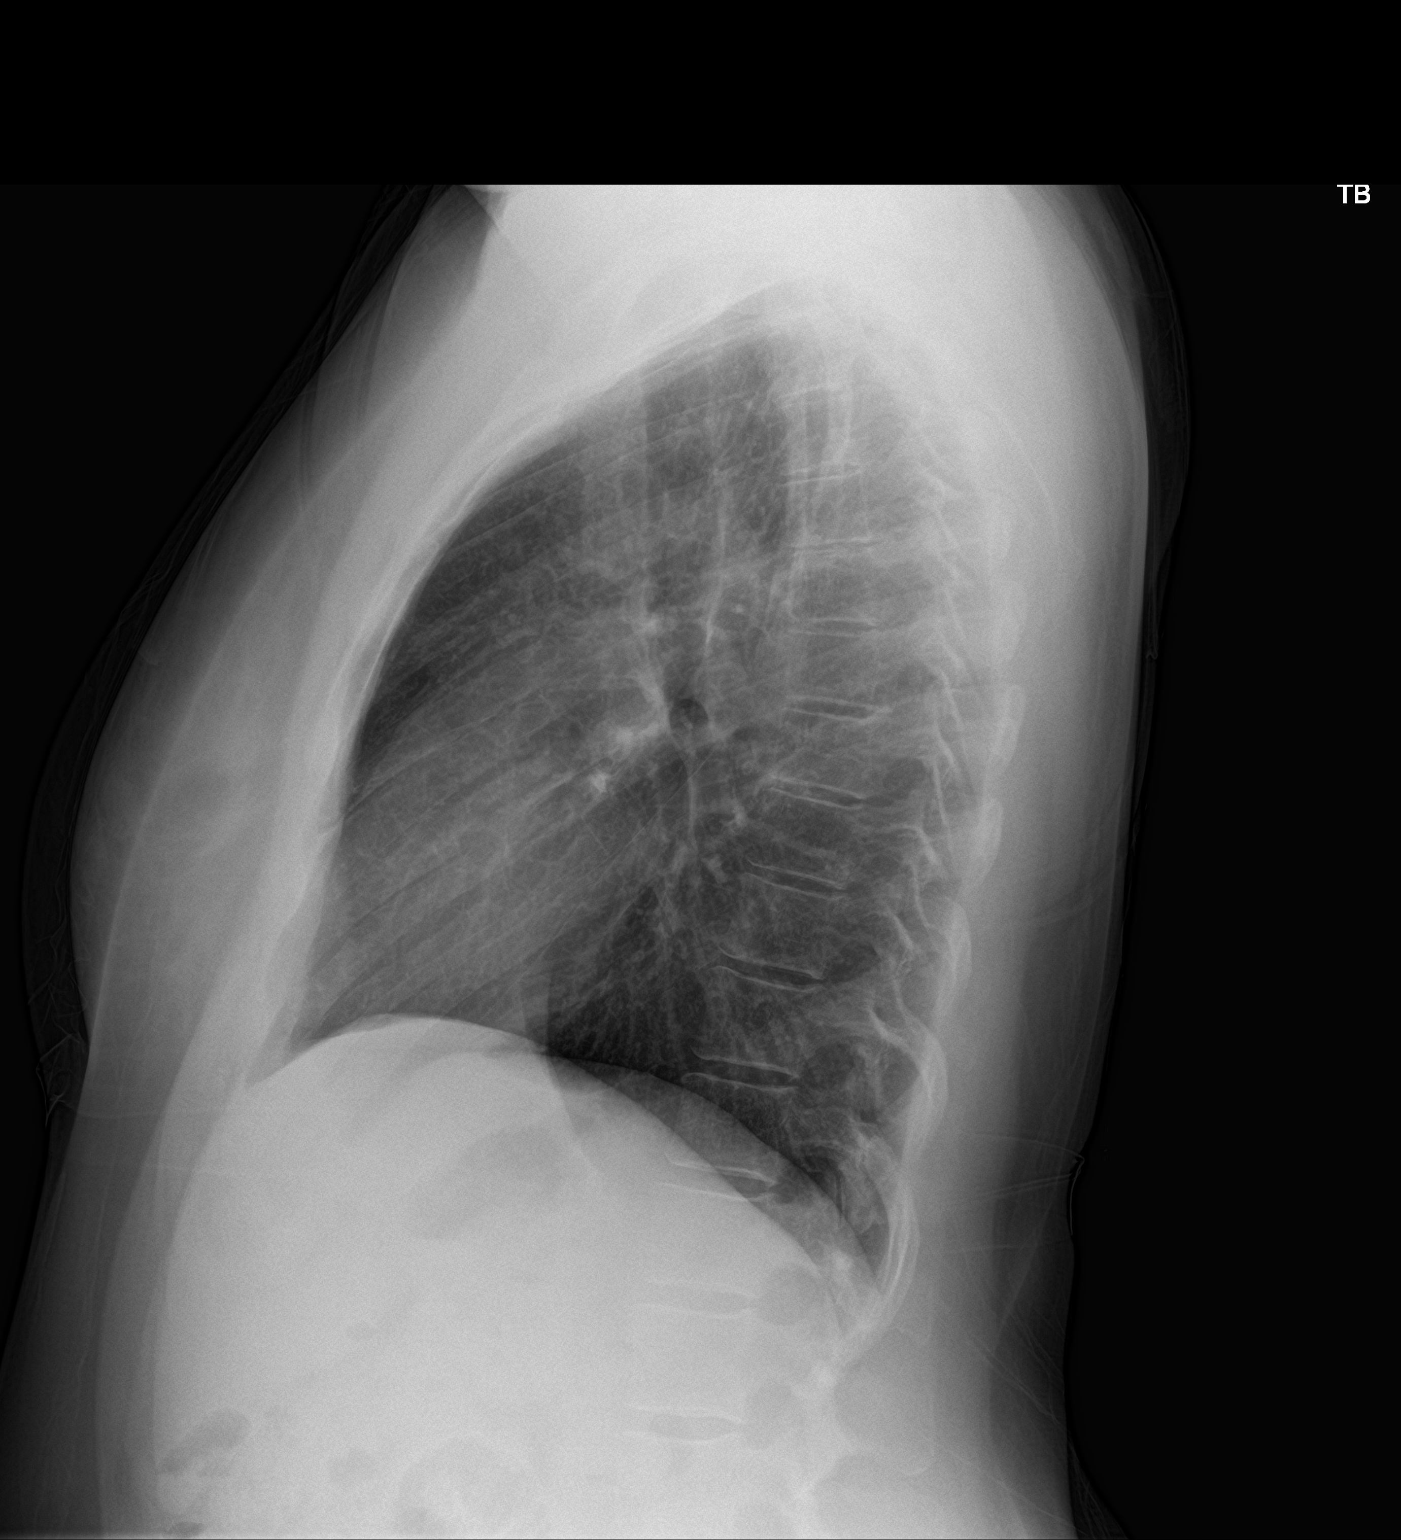

[2 of 2 positions shown; findings below may reference images not displayed]

FINDINGS: Heart size normal. Lungs are free of focal consolidations. No
pleural effusions or pulmonary edema. There is mild prominence of
interstitial markings.
IMPRESSION: Prominent interstitial markings.  No focal pulmonary abnormality.
